# Patient Record
Sex: Male | Born: 1960
Health system: Southern US, Community
[De-identification: ages and names within clinical notes are randomized; demographics above are authoritative.]

## PROBLEM LIST (undated history)

## (undated) DIAGNOSIS — M5126 Other intervertebral disc displacement, lumbar region: Secondary | ICD-10-CM

## (undated) DIAGNOSIS — R002 Palpitations: Secondary | ICD-10-CM

## (undated) DIAGNOSIS — I10 Essential (primary) hypertension: Secondary | ICD-10-CM

## (undated) DIAGNOSIS — K219 Gastro-esophageal reflux disease without esophagitis: Secondary | ICD-10-CM

## (undated) HISTORY — DX: Other intervertebral disc displacement, lumbar region: M51.26

## (undated) HISTORY — DX: Essential (primary) hypertension: I10

## (undated) HISTORY — PX: NASAL SEPTUM SURGERY: SHX37

## (undated) HISTORY — DX: Gastro-esophageal reflux disease without esophagitis: K21.9

## (undated) HISTORY — DX: Palpitations: R00.2

---

## 1998-06-14 ENCOUNTER — Other Ambulatory Visit: Admission: RE | Admit: 1998-06-14 | Discharge: 1998-06-14 | Payer: Self-pay | Admitting: Gynecology

## 2002-10-27 ENCOUNTER — Encounter: Admission: RE | Admit: 2002-10-27 | Discharge: 2002-10-27 | Payer: Self-pay | Admitting: Otolaryngology

## 2002-10-27 ENCOUNTER — Encounter: Payer: Self-pay | Admitting: Otolaryngology

## 2005-07-25 ENCOUNTER — Encounter: Admission: RE | Admit: 2005-07-25 | Discharge: 2005-07-25 | Payer: Self-pay | Admitting: Chiropractic Medicine

## 2006-01-08 ENCOUNTER — Encounter: Admission: RE | Admit: 2006-01-08 | Discharge: 2006-01-08 | Payer: Self-pay | Admitting: *Deleted

## 2006-02-28 ENCOUNTER — Ambulatory Visit (HOSPITAL_COMMUNITY): Admission: RE | Admit: 2006-02-28 | Discharge: 2006-02-28 | Payer: Self-pay | Admitting: *Deleted

## 2008-01-16 ENCOUNTER — Emergency Department (HOSPITAL_COMMUNITY): Admission: EM | Admit: 2008-01-16 | Discharge: 2008-01-16 | Payer: Self-pay | Admitting: Family Medicine

## 2008-03-09 ENCOUNTER — Emergency Department (HOSPITAL_COMMUNITY): Admission: EM | Admit: 2008-03-09 | Discharge: 2008-03-09 | Payer: Self-pay | Admitting: Family Medicine

## 2008-07-06 ENCOUNTER — Encounter: Admission: RE | Admit: 2008-07-06 | Discharge: 2008-07-06 | Payer: Self-pay | Admitting: Sports Medicine

## 2009-03-22 ENCOUNTER — Encounter: Admission: RE | Admit: 2009-03-22 | Discharge: 2009-03-22 | Payer: Self-pay | Admitting: Sports Medicine

## 2009-05-02 ENCOUNTER — Emergency Department (HOSPITAL_COMMUNITY): Admission: EM | Admit: 2009-05-02 | Discharge: 2009-05-02 | Payer: Self-pay | Admitting: Emergency Medicine

## 2009-06-02 ENCOUNTER — Encounter: Admission: RE | Admit: 2009-06-02 | Discharge: 2009-06-02 | Payer: Self-pay | Admitting: Sports Medicine

## 2010-06-14 ENCOUNTER — Encounter: Admission: RE | Admit: 2010-06-14 | Discharge: 2010-08-13 | Payer: Self-pay | Admitting: Sports Medicine

## 2011-02-25 LAB — CBC
HCT: 42.5 % (ref 39.0–52.0)
Hemoglobin: 14.5 g/dL (ref 13.0–17.0)
MCHC: 34 g/dL (ref 30.0–36.0)
MCV: 97.9 fL (ref 78.0–100.0)
Platelets: 248 10*3/uL (ref 150–400)
RBC: 4.35 MIL/uL (ref 4.22–5.81)
RDW: 13.6 % (ref 11.5–15.5)
WBC: 7.9 10*3/uL (ref 4.0–10.5)

## 2011-02-25 LAB — TSH: TSH: 1.099 u[IU]/mL (ref 0.350–4.500)

## 2013-06-16 ENCOUNTER — Encounter: Payer: Self-pay | Admitting: Orthopedic Surgery

## 2013-06-18 ENCOUNTER — Encounter: Payer: Self-pay | Admitting: Orthopedic Surgery

## 2013-07-19 ENCOUNTER — Encounter: Payer: Self-pay | Admitting: Orthopedic Surgery

## 2013-11-12 ENCOUNTER — Other Ambulatory Visit: Payer: Self-pay | Admitting: Family Medicine

## 2013-11-12 ENCOUNTER — Ambulatory Visit
Admission: RE | Admit: 2013-11-12 | Discharge: 2013-11-12 | Disposition: A | Discharge status: Home / Self Care | Payer: 59 | Source: Ambulatory Visit | Attending: Family Medicine | Admitting: Family Medicine

## 2013-11-12 DIAGNOSIS — R0602 Shortness of breath: Secondary | ICD-10-CM

## 2015-12-06 ENCOUNTER — Encounter (HOSPITAL_COMMUNITY): Payer: Self-pay | Admitting: *Deleted

## 2015-12-06 ENCOUNTER — Emergency Department (INDEPENDENT_AMBULATORY_CARE_PROVIDER_SITE_OTHER): Payer: 59

## 2015-12-06 ENCOUNTER — Emergency Department (HOSPITAL_COMMUNITY)
Admission: EM | Admit: 2015-12-06 | Discharge: 2015-12-06 | Disposition: A | Discharge status: Home / Self Care | Payer: 59 | Source: Home / Self Care | Attending: Family Medicine | Admitting: Family Medicine

## 2015-12-06 DIAGNOSIS — M545 Low back pain, unspecified: Secondary | ICD-10-CM

## 2015-12-06 MED ORDER — CYCLOBENZAPRINE HCL 5 MG PO TABS: 5.0000 mg | ORAL_TABLET | Freq: Three times a day (TID) | ORAL | Status: DC | PRN

## 2015-12-06 MED ORDER — METHYLPREDNISOLONE 4 MG PO TBPK: ORAL_TABLET | ORAL | Status: DC

## 2015-12-06 MED ORDER — TRIAMCINOLONE ACETONIDE 40 MG/ML IJ SUSP
INTRAMUSCULAR | Status: AC
  Filled 2015-12-06: qty 1

## 2015-12-06 MED ORDER — TRIAMCINOLONE ACETONIDE 40 MG/ML IJ SUSP
40.0000 mg | Freq: Once | INTRAMUSCULAR | Status: AC
  Administered 2015-12-06: 40 mg via INTRAMUSCULAR

## 2015-12-06 MED ORDER — KETOROLAC TROMETHAMINE 30 MG/ML IJ SOLN
30.0000 mg | Freq: Once | INTRAMUSCULAR | Status: AC
  Administered 2015-12-06: 30 mg via INTRAMUSCULAR

## 2015-12-06 MED ORDER — KETOROLAC TROMETHAMINE 30 MG/ML IJ SOLN
INTRAMUSCULAR | Status: AC
  Filled 2015-12-06: qty 1

## 2015-12-06 MED FILL — CYCLOBENZAPRINE 5 MG TABLET: 5 | 10 days supply | Qty: 30 | Fill #0

## 2015-12-06 MED FILL — METHYLPREDNISOLONE 4 MG TAB: 4 | 6 days supply | Qty: 21 | Fill #0

## 2015-12-06 NOTE — ED Provider Notes (Addendum)
CSN: [id number]     Arrival date & time 12/06/15  1300 History   None    Chief Complaint  Patient presents with  . Back Pain   (Consider location/radiation/quality/duration/timing/severity/associated sxs/prior Treatment) Patient is a 55 y.o. male presenting with back pain. The history is provided by the patient.  Back Pain Location:  Lumbar spine Quality:  Stabbing Radiates to:  Does not radiate Pain severity:  Mild Onset quality:  Sudden Duration:  2 days Progression:  Unchanged Chronicity:  New Context comment:  Known ddd , onset getting oob yest am, no neuro or gi sx. Relieved by:  None tried Worsened by:  Nothing tried Ineffective treatments:  None tried Associated symptoms: no abdominal pain, no bladder incontinence, no bowel incontinence, no leg pain, no numbness, no paresthesias, no pelvic pain and no weakness     History reviewed. No pertinent past medical history. History reviewed. No pertinent past surgical history. History reviewed. No pertinent family history. Social History  Substance Use Topics  . Smoking status: Never Smoker   . Smokeless tobacco: None  . Alcohol Use: No    Review of Systems  Gastrointestinal: Negative.  Negative for abdominal pain and bowel incontinence.  Genitourinary: Negative.  Negative for bladder incontinence and pelvic pain.  Musculoskeletal: Positive for back pain. Negative for gait problem.  Skin: Negative.   Neurological: Negative for weakness, numbness and paresthesias.  All other systems reviewed and are negative.   Allergies  Review of patient's allergies indicates no known allergies.  Home Medications   Prior to Admission medications   Medication Sig Start Date End Date Taking? Authorizing Provider  cyclobenzaprine (FLEXERIL) 5 MG tablet Take 1 tablet (5 mg total) by mouth 3 (three) times daily as needed for muscle spasms. 12/06/15   Billy Fischer, MD  methylPREDNISolone (MEDROL DOSEPAK) 4 MG TBPK tablet follow package  directions, start on thurs, take until finished 12/06/15   Billy Fischer, MD   Meds Ordered and Administered this Visit   Medications  ketorolac (TORADOL) 30 MG/ML injection 30 mg (30 mg Intramuscular Given 12/06/15 1421)  triamcinolone acetonide (KENALOG-40) injection 40 mg (40 mg Intramuscular Given 12/06/15 1421)    BP 138/80 mmHg  Pulse 78  Temp(Src) 98.6 F (37 C) (Oral)  Resp 18  SpO2 99% No data found.   Physical Exam  Constitutional: He is oriented to person, place, and time. He appears well-developed and well-nourished.  Abdominal: Soft. Bowel sounds are normal. There is no tenderness.  Musculoskeletal: He exhibits tenderness.       Lumbar back: He exhibits decreased range of motion, tenderness, bony tenderness and spasm. He exhibits no swelling, no deformity, no pain and normal pulse.  Neurological: He is alert and oriented to person, place, and time.  Skin: Skin is warm and dry.  Nursing note and vitals reviewed.   ED Course  Procedures (including critical care time)  Labs Review Labs Reviewed - No data to display  Imaging Review No results found. X-rays reviewed and report per radiologist.   Visual Acuity Review  Right Eye Distance:   Left Eye Distance:   Bilateral Distance:    Right Eye Near:   Left Eye Near:    Bilateral Near:     .med   MDM   Meds ordered this encounter  Medications  . ketorolac (TORADOL) 30 MG/ML injection 30 mg    Sig:   . triamcinolone acetonide (KENALOG-40) injection 40 mg    Sig:   . methylPREDNISolone (MEDROL  DOSEPAK) 4 MG TBPK tablet    Sig: follow package directions, start on thurs, take until finished    Dispense:  21 tablet    Refill:  0  . cyclobenzaprine (FLEXERIL) 5 MG tablet    Sig: Take 1 tablet (5 mg total) by mouth 3 (three) times daily as needed for muscle spasms.    Dispense:  30 tablet    Refill:  0      Billy Fischer, MD 12/06/15 1415  Billy Fischer, MD 12/06/15 1436  Billy Fischer,  MD 12/08/15 2003

## 2015-12-06 NOTE — ED Notes (Signed)
Pt  Reports  Low  Back  Pain   Since  Yesterday     denys   Any  specefic  Injury          Pt  denys   Any        Urinary  Symptoms

## 2015-12-06 NOTE — Discharge Instructions (Signed)
Heat and medicine as needed.see orthopedist if further problems.

## 2015-12-20 DIAGNOSIS — R05 Cough: Secondary | ICD-10-CM | POA: Diagnosis not present

## 2015-12-20 DIAGNOSIS — E559 Vitamin D deficiency, unspecified: Secondary | ICD-10-CM | POA: Diagnosis not present

## 2015-12-20 DIAGNOSIS — K589 Irritable bowel syndrome without diarrhea: Secondary | ICD-10-CM | POA: Diagnosis not present

## 2015-12-20 DIAGNOSIS — R5383 Other fatigue: Secondary | ICD-10-CM | POA: Diagnosis not present

## 2015-12-20 DIAGNOSIS — K219 Gastro-esophageal reflux disease without esophagitis: Secondary | ICD-10-CM | POA: Diagnosis not present

## 2015-12-20 DIAGNOSIS — F419 Anxiety disorder, unspecified: Secondary | ICD-10-CM | POA: Diagnosis not present

## 2015-12-21 MED FILL — ZOLPIDEM TARTRATE 10 MG TAB: 10 | 30 days supply | Qty: 30 | Fill #0

## 2016-01-17 MED FILL — SM NASAL DECONGEST 30 MG TA: 30 MG | 96 days supply | Qty: 96 | Fill #0

## 2016-01-19 MED FILL — DEPO-TESTOSTERONE 200 MG/ML: 200 | 84 days supply | Qty: 6 | Fill #0 | Status: TO

## 2016-01-25 DIAGNOSIS — R0602 Shortness of breath: Secondary | ICD-10-CM | POA: Diagnosis not present

## 2016-01-25 DIAGNOSIS — J301 Allergic rhinitis due to pollen: Secondary | ICD-10-CM | POA: Diagnosis not present

## 2016-01-29 DIAGNOSIS — J301 Allergic rhinitis due to pollen: Secondary | ICD-10-CM | POA: Diagnosis not present

## 2016-02-16 MED FILL — ETODOLAC 400 MG TABLET: 400 | 90 days supply | Qty: 180 | Fill #0

## 2016-02-29 DIAGNOSIS — M5136 Other intervertebral disc degeneration, lumbar region: Secondary | ICD-10-CM | POA: Diagnosis not present

## 2016-02-29 DIAGNOSIS — M545 Low back pain: Secondary | ICD-10-CM | POA: Diagnosis not present

## 2016-03-07 DIAGNOSIS — F419 Anxiety disorder, unspecified: Secondary | ICD-10-CM | POA: Diagnosis not present

## 2016-03-07 DIAGNOSIS — J300 Vasomotor rhinitis: Secondary | ICD-10-CM | POA: Diagnosis not present

## 2016-03-07 MED FILL — predniSONE 10 MG TABS: 10 | 12 days supply | Qty: 48 | Fill #0

## 2016-03-08 MED FILL — IPRATROPIUM 0.06% SPRAY: 0.06 | 30 days supply | Qty: 15 | Fill #0

## 2016-03-08 MED FILL — LORazepam 0.5 MG TABS: 0.5 | 30 days supply | Qty: 30 | Fill #0

## 2016-03-08 MED FILL — cloNIDine HCL 0.1 MG TABS: 0.1 | 30 days supply | Qty: 30 | Fill #0

## 2016-03-12 DIAGNOSIS — H6121 Impacted cerumen, right ear: Secondary | ICD-10-CM | POA: Diagnosis not present

## 2016-03-12 DIAGNOSIS — R05 Cough: Secondary | ICD-10-CM | POA: Diagnosis not present

## 2016-03-12 DIAGNOSIS — R0981 Nasal congestion: Secondary | ICD-10-CM | POA: Diagnosis not present

## 2016-03-15 ENCOUNTER — Other Ambulatory Visit (HOSPITAL_COMMUNITY): Payer: Self-pay | Admitting: Otolaryngology

## 2016-03-15 DIAGNOSIS — R519 Headache, unspecified: Secondary | ICD-10-CM

## 2016-03-15 DIAGNOSIS — G8929 Other chronic pain: Secondary | ICD-10-CM

## 2016-03-15 DIAGNOSIS — J310 Chronic rhinitis: Secondary | ICD-10-CM

## 2016-03-15 DIAGNOSIS — R51 Headache: Secondary | ICD-10-CM

## 2016-04-02 ENCOUNTER — Ambulatory Visit (HOSPITAL_COMMUNITY): Payer: 59

## 2016-04-04 MED FILL — cloNIDine HCL 0.1 MG TABS: 0.1 | 90 days supply | Qty: 90 | Fill #1

## 2016-04-09 ENCOUNTER — Ambulatory Visit (HOSPITAL_COMMUNITY): Payer: 59

## 2016-04-10 MED FILL — SUDOGEST 30 MG TABLET: 30 | 24 days supply | Qty: 96 | Fill #1

## 2016-05-03 MED FILL — metroNIDAZOLE 250 MG TABS: 250 | 7 days supply | Qty: 21 | Fill #0

## 2016-05-17 ENCOUNTER — Inpatient Hospital Stay: Admission: RE | Admit: 2016-05-17 | Payer: 59 | Source: Ambulatory Visit

## 2016-05-23 ENCOUNTER — Ambulatory Visit
Admission: RE | Admit: 2016-05-23 | Discharge: 2016-05-23 | Disposition: A | Discharge status: Home / Self Care | Payer: 59 | Source: Ambulatory Visit | Attending: Otolaryngology | Admitting: Otolaryngology

## 2016-05-23 DIAGNOSIS — J310 Chronic rhinitis: Secondary | ICD-10-CM

## 2016-05-23 DIAGNOSIS — G8929 Other chronic pain: Secondary | ICD-10-CM

## 2016-05-23 DIAGNOSIS — J3489 Other specified disorders of nose and nasal sinuses: Secondary | ICD-10-CM | POA: Diagnosis not present

## 2016-05-23 DIAGNOSIS — R51 Headache: Secondary | ICD-10-CM

## 2016-05-23 DIAGNOSIS — R519 Headache, unspecified: Secondary | ICD-10-CM

## 2016-05-23 MED FILL — ETODOLAC 400 MG TABLET: 400 | 90 days supply | Qty: 180 | Fill #1

## 2016-05-23 MED FILL — AZELASTINE 0.15% NASAL SPRY: 0.15 | 44 days supply | Qty: 30 | Fill #0

## 2016-06-14 MED FILL — ESOMEPRAZOLE MAG DR 40 MG C: 40 | 90 days supply | Qty: 180 | Fill #0

## 2016-06-17 MED FILL — METOPROLOL TARTRATE 50 MG T: 50 | 30 days supply | Qty: 60 | Fill #0

## 2016-06-18 ENCOUNTER — Encounter: Payer: Self-pay | Admitting: Allergy and Immunology

## 2016-06-18 ENCOUNTER — Ambulatory Visit (INDEPENDENT_AMBULATORY_CARE_PROVIDER_SITE_OTHER): Payer: 59 | Admitting: Allergy and Immunology

## 2016-06-18 VITALS — BP 104/78 | HR 76 | Temp 97.9°F | Resp 16 | Ht 69.0 in | Wt 208.0 lb

## 2016-06-18 DIAGNOSIS — J309 Allergic rhinitis, unspecified: Secondary | ICD-10-CM

## 2016-06-18 DIAGNOSIS — J4530 Mild persistent asthma, uncomplicated: Secondary | ICD-10-CM | POA: Diagnosis not present

## 2016-06-18 DIAGNOSIS — J324 Chronic pansinusitis: Secondary | ICD-10-CM

## 2016-06-18 DIAGNOSIS — H1010 Acute atopic conjunctivitis, unspecified eye: Secondary | ICD-10-CM

## 2016-06-18 MED ORDER — AZELASTINE-FLUTICASONE 137-50 MCG/ACT NA SUSP: NASAL | 5 refills | Status: DC

## 2016-06-18 MED ORDER — ALBUTEROL SULFATE HFA 108 (90 BASE) MCG/ACT IN AERS: INHALATION_SPRAY | RESPIRATORY_TRACT | 1 refills | Status: DC

## 2016-06-18 MED FILL — VENTOLIN HFA 90 MCG INHALER: 108 (90 BAS | 25 days supply | Qty: 18 | Fill #0

## 2016-06-18 NOTE — Progress Notes (Signed)
NEW PATIENT NOTE  Referring Provider: No ref. provider found Primary Provider: Shirline Frees, MD Date of office visit: 06/18/2016    Subjective:   Chief Complaint:  Jason Schneider (DOB: 31-Aug-1961) is a 55 y.o. male with a chief complaint of Nasal Congestion  who presents to the clinic on 06/18/2016 with the following problems:  HPI: Jason Schneider presents to this clinic in evaluation of persistent upper airway symptoms that has been present since very early in life. Apparently he completed a course of immunotherapy around the age of 65 to the 24s and he still continued to have problems. He complains about nasal congestion and stuffiness and intermittent sneezing and feeling as though his nose is a little bit dry and his eyes are little bit dry. He can smell without much problem and he does not have any decreased ability to taste. He does have recurrent headaches averaging out to about 1 per month or so usually a frontal and forehead region without any associated neurological symptoms. He believes that provoking factors include exposure to domestic animals and pollens. He's much worse during the spring and fall especially when outdoors.  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 This problem has been going on for about 2 years or so. He exercises several times per week on a treadmill without any problem and does not have any cold air induced bronchospastic symptoms.  Apparently Jason Schneider has seen Dr. Wilburn Cornelia who performed a CT scan of his sinuses which identified some degree of chronic sinusitis. He gets treated with an antibiotic and a systemic steroid about 3 times in the past 5 years. He will use Afrin about 5 times per month when he can't breathe at nighttime. Apparently he has tried Singulair in the past but he had some type of adverse reaction and  will not use this medication.    Past Medical History:  Diagnosis Date  . GERD (gastroesophageal reflux disease)   . High blood pressure   . Lumbar herniated disc     Past Surgical History:  Procedure Laterality Date  . NASAL SEPTUM SURGERY        Medication List      CLARITIN 10 MG tablet Generic drug:  loratadine Take 10 mg by mouth daily.   esomeprazole 40 MG capsule Commonly known as:  NEXIUM Take 40 mg by mouth daily.   etodolac 400 MG tablet Commonly known as:  LODINE Take 400 mg by mouth 2 (two) times daily.   metoprolol 50 MG tablet Commonly known as:  LOPRESSOR Take 50 mg by mouth daily.   multivitamin capsule Take 1 capsule by mouth daily.   SUDAFED PO Take 1 capsule by mouth daily.   testosterone cypionate 200 MG/ML injection Commonly known as:  DEPOTESTOSTERONE CYPIONATE INJECT 0.25ML TWICE WEEKLY INTO THIGH       No Known Allergies  Review of systems negative except as noted in HPI / PMHx or noted below:  Review of Systems  Constitutional: Negative.   HENT: Negative.   Eyes: Negative.   Respiratory: Negative.   Cardiovascular: Negative.   Gastrointestinal: Negative.   Genitourinary: Negative.   Musculoskeletal: Negative.   Skin: Negative.   Neurological: Negative.   Endo/Heme/Allergies: Negative.   Psychiatric/Behavioral: Negative.     Family History  Problem Relation Age of Onset  . Cancer Brother   .  Heart attack Maternal Uncle   . Cancer Brother   . Heart attack Maternal Uncle     Social History   Social History  . Marital status: Married    Spouse name: N/A  . Number of children: N/A  . Years of education: N/A   Occupational History  . Not on file.   Social History Main Topics  . Smoking status: Never Smoker  . Smokeless tobacco: Never Used  . Alcohol use No  . Drug use: No  . Sexual activity: Not on file   Other Topics Concern  . Not on file   Social History Narrative  . No narrative on file     Environmental and Social history  Jason Schneider lives in a house with a dry environment, no animals located inside the household, would in the bedroom, plastic on the bed and pillow, and no smokers located inside the household. He is an Lobbyist and property management   Objective:   Vitals:   06/18/16 1447  BP: 104/78  Pulse: 76  Resp: 16  Temp: 97.9 F (36.6 C)   Height: 5\' 9"  (175.3 cm) Weight: 208 lb (94.3 kg)  Physical Exam  Constitutional: He is well-developed, well-nourished, and in no distress.  HENT:  Head: Normocephalic. Head is without right periorbital erythema and without left periorbital erythema.  Right Ear: Tympanic membrane, external ear and ear canal normal.  Left Ear: Tympanic membrane, external ear and ear canal normal.  Nose: Mucosal edema present. No rhinorrhea.  Mouth/Throat: Oropharynx is clear and moist and mucous membranes are normal. No oropharyngeal exudate.  Eyes: Conjunctivae and lids are normal. Pupils are equal, round, and reactive to light.  Neck: Trachea normal. No tracheal deviation present. No thyromegaly present.  Cardiovascular: Normal rate, regular rhythm, S1 normal, S2 normal and normal heart sounds.   No murmur heard. Pulmonary/Chest: Effort normal. No stridor. No tachypnea. No respiratory distress. He has no wheezes. He has no rales. He exhibits no tenderness.  Abdominal: Soft. He exhibits no distension and no mass. There is no hepatosplenomegaly. There is no tenderness. There is no rebound and no guarding.  Musculoskeletal: He exhibits no edema or tenderness.  Lymphadenopathy:       Head (right side): No tonsillar adenopathy present.       Head (left side): No tonsillar adenopathy present.    He has no cervical adenopathy.    He has no axillary adenopathy.  Neurological: He is alert. Gait normal.  Skin: No rash noted. He is not diaphoretic. No erythema. No pallor. Nails show no clubbing.  Psychiatric: Mood and affect normal.      Diagnostics: Allergy skin tests were performed. He did not demonstrate any hypersensitivity to a screening panel of aeroallergens or foods. He had a very small histamine control.  Spirometry was performed and demonstrated an FEV1 of 3.52 @ 100 % of predicted.  Results of a limited coronal sinus CT scan obtained on 05/23/2016 identified bilateral infundibular narrowing, worse on the left in association with mild mucosal thickening in multiple sinuses including maxillary, ethmoid, sphenoid, and frontal sinuses.  The patient had an Asthma Control Test with the following results:  .     Assessment and Plan:    1. Allergic rhinoconjunctivitis   2. Chronic pansinusitis   3. Asthma, well controlled, mild persistent     1. Allergen avoidance measures  2. Treat and prevent inflammation:   A. Dymista - one spray each nostril twice a day  B. prednisone 10  mg - one tablet once a day for 10 days  3. If needed:   A. OTC antihistamine - Zyrtec/Allegra/Claritin  B. Proventil HFA 2 puffs every 4-6 hours  C. nasal saline wash  4. Immunotherapy? Obtain Area 2 allergen profile  5. Return to clinic in 4 weeks or earlier if problem  Certainly Jason Schneider has a significant amount of problems with his upper airways and has developed some issues with his lower airways. Most of this appears to be mucosal swelling and hopefully he is going to benefit from allergen avoidance measures and consistently use of some anti-inflammatory medications as specified above. He would definitely be a candidate for immunotherapy if he fails medical treatment. As well, he may benefit from anatomical rearrangement of his upper airways to provide him a little bit more open conduit through his upper airway.  Allena Katz, MD [place]

## 2016-06-18 NOTE — Patient Instructions (Addendum)
  1. Allergen avoidance measures  2. Treat and prevent inflammation:   A. Dymista - one spray each nostril twice a day  B. prednisone 10 mg - one tablet once a day for 10 days  3. If needed:   A. OTC antihistamine - Zyrtec/Allegra/Claritin  B. Proventil HFA 2 puffs every 4-6 hours  C. nasal saline wash  4. Immunotherapy? Obtain area 2 aero allergen profile  5. Return to clinic in 4 weeks or earlier if problem

## 2016-06-20 DIAGNOSIS — Z7689 Persons encountering health services in other specified circumstances: Secondary | ICD-10-CM | POA: Diagnosis not present

## 2016-07-12 DIAGNOSIS — J324 Chronic pansinusitis: Secondary | ICD-10-CM | POA: Diagnosis not present

## 2016-07-12 DIAGNOSIS — J310 Chronic rhinitis: Secondary | ICD-10-CM | POA: Diagnosis not present

## 2016-07-24 MED FILL — cloNIDine HCL 0.1 MG TABS: 0.1 | 90 days supply | Qty: 90 | Fill #2

## 2016-07-25 MED FILL — SM NASAL DECONGEST 30 MG TA: 30 MG | 24 days supply | Qty: 96 | Fill #0

## 2016-08-12 MED FILL — ETODOLAC 400 MG TABLET: 400 | 90 days supply | Qty: 180 | Fill #0

## 2016-09-12 MED FILL — METOPROLOL TARTRATE 50 MG T: 50 | 30 days supply | Qty: 60 | Fill #0

## 2016-10-15 MED FILL — SM NASAL DECONGEST 30 MG TA: 30 MG | 24 days supply | Qty: 96 | Fill #1

## 2016-10-16 MED FILL — cloNIDine HCL 0.1 MG TABS: 0.1 | 90 days supply | Qty: 90 | Fill #0

## 2016-10-24 DIAGNOSIS — E291 Testicular hypofunction: Secondary | ICD-10-CM | POA: Diagnosis not present

## 2016-10-24 DIAGNOSIS — E559 Vitamin D deficiency, unspecified: Secondary | ICD-10-CM | POA: Diagnosis not present

## 2016-10-24 DIAGNOSIS — R5383 Other fatigue: Secondary | ICD-10-CM | POA: Diagnosis not present

## 2016-10-24 DIAGNOSIS — R7309 Other abnormal glucose: Secondary | ICD-10-CM | POA: Diagnosis not present

## 2016-11-06 DIAGNOSIS — M7582 Other shoulder lesions, left shoulder: Secondary | ICD-10-CM | POA: Diagnosis not present

## 2016-12-13 DIAGNOSIS — R7982 Elevated C-reactive protein (CRP): Secondary | ICD-10-CM | POA: Diagnosis not present

## 2016-12-13 DIAGNOSIS — E349 Endocrine disorder, unspecified: Secondary | ICD-10-CM | POA: Diagnosis not present

## 2016-12-13 DIAGNOSIS — R5383 Other fatigue: Secondary | ICD-10-CM | POA: Diagnosis not present

## 2016-12-13 DIAGNOSIS — G4700 Insomnia, unspecified: Secondary | ICD-10-CM | POA: Diagnosis not present

## 2016-12-13 DIAGNOSIS — M2550 Pain in unspecified joint: Secondary | ICD-10-CM | POA: Diagnosis not present

## 2016-12-23 MED FILL — METOPROLOL TARTRATE 50 MG T: 50 | 60 days supply | Qty: 120 | Fill #0

## 2016-12-23 MED FILL — ETODOLAC 400 MG TABLET: 400 | 90 days supply | Qty: 180 | Fill #0

## 2016-12-25 MED FILL — SUDOGEST 30 MG TABLET: 30 | 25 days supply | Qty: 100 | Fill #0

## 2016-12-25 MED FILL — OSELTAMIVIR PHOS 75 MG CAP: 75 | 5 days supply | Qty: 10 | Fill #0

## 2017-01-03 DIAGNOSIS — R5383 Other fatigue: Secondary | ICD-10-CM | POA: Diagnosis not present

## 2017-01-09 DIAGNOSIS — R7982 Elevated C-reactive protein (CRP): Secondary | ICD-10-CM | POA: Diagnosis not present

## 2017-01-09 DIAGNOSIS — E7212 Methylenetetrahydrofolate reductase deficiency: Secondary | ICD-10-CM | POA: Diagnosis not present

## 2017-01-09 DIAGNOSIS — M2550 Pain in unspecified joint: Secondary | ICD-10-CM | POA: Diagnosis not present

## 2017-01-09 DIAGNOSIS — R5383 Other fatigue: Secondary | ICD-10-CM | POA: Diagnosis not present

## 2017-01-15 DIAGNOSIS — R002 Palpitations: Secondary | ICD-10-CM | POA: Diagnosis not present

## 2017-01-15 DIAGNOSIS — R945 Abnormal results of liver function studies: Secondary | ICD-10-CM | POA: Diagnosis not present

## 2017-02-03 MED FILL — cloNIDine HCL 0.1 MG TABS: 0.1 | 90 days supply | Qty: 90 | Fill #0

## 2017-02-11 DIAGNOSIS — M25512 Pain in left shoulder: Secondary | ICD-10-CM | POA: Diagnosis not present

## 2017-02-12 DIAGNOSIS — M25512 Pain in left shoulder: Secondary | ICD-10-CM | POA: Diagnosis not present

## 2017-02-20 NOTE — Progress Notes (Signed)
Cardiology Office Note  Date:  02/21/2017   ID:  Jason Schneider, DOB 09/16/1961, MRN [id number]  PCP:  Shirline Frees, MD   Chief Complaint  Patient presents with  . other    New patient. Referred by Shirline Frees. Patient c/o heart skipping beats and SOB. Meds review verbally with patient.     HPI:  56 year old gentleman with history of hypertension, GERD, palpitations, insomnia, anxiety  Chronic Sinus problems, rhinitis Who presents by referral from Dr. Shirline Frees for consultation of his palpitations.  He reports that he is typically a type A personality person Seems to have bursts of "adrenaline"commonly in the morning. Periodically takes his metoprolol Tartrate for that feeling of excitement/energy. The Toprol seems to help his symptoms. He does not take this on a regular basis  Over the past few years has noticed rare episodes of shortness of breath, sometimes coming during the daytime, often at night when he is in a relaxed state When he feels a shortness of breath feeling, he checks his pulse. Often will feel regular rhythm with occasional pauses. He is not symptomatic from the beating only from the shortness of breath seems to resolve on its own without intervention  Otherwise he is very active, goes to the gym in the evenings on a regular basis. He has not noticed any change in his exertional capacity  Continues to have problems with chronic rhinitis, "very sensitive nose" Periodically take Sudafed among other allergy medications Has not noticed a clear correlation between his heart arrhythmia and his medication  Flu type symptoms February 2018 "down for a few weeks"  In terms of his family history, father lived to age 36,   EKG on today's visit person only reviewed by myself shows normal sinus rhythm with rate 69 bpm no significant ST or T-wave changes  PMH:   has a past medical history of GERD (gastroesophageal reflux disease); High blood pressure; Lumbar  herniated disc; and Palpitations.  PSH:    Past Surgical History:  Procedure Laterality Date  . NASAL SEPTUM SURGERY      Current Outpatient Prescriptions  Medication Sig Dispense Refill  . albuterol (PROVENTIL HFA) 108 (90 Base) MCG/ACT inhaler Inhale two puffs every four to six hours as needed for cough or wheeze. 1 Inhaler 1  . Azelastine-Fluticasone (DYMISTA) 137-50 MCG/ACT SUSP Use one spray in each nostril twice daily as directed. 1 Bottle 5  . etodolac (LODINE) 400 MG tablet Take 400 mg by mouth 2 (two) times daily.     Marland Kitchen loratadine (CLARITIN) 10 MG tablet Take 10 mg by mouth daily.    . metoprolol (LOPRESSOR) 50 MG tablet Take 50 mg by mouth daily.    . Multiple Vitamin (MULTIVITAMIN) capsule Take 1 capsule by mouth daily.    . Pseudoephedrine HCl (SUDAFED PO) Take 1 capsule by mouth daily.    Marland Kitchen testosterone cypionate (DEPOTESTOSTERONE CYPIONATE) 200 MG/ML injection INJECT 0.25ML TWICE WEEKLY INTO THIGH  2  . nebivolol (BYSTOLIC) 5 MG tablet Take 1 tablet (5 mg total) by mouth daily. 30 tablet   . propranolol (INDERAL) 20 MG tablet Take 1 tablet (20 mg total) by mouth 3 (three) times daily as needed. 90 tablet 3   No current facility-administered medications for this visit.      Allergies:   Patient has no known allergies.   Social History:  The patient  reports that he has never smoked. He has never used smokeless tobacco. He reports that he does not drink alcohol  or use drugs.   Family History:   family history includes Cancer in his brother and brother; Heart attack in his maternal uncle and maternal uncle.    Review of Systems: Review of Systems  Constitutional: Negative.   HENT: Positive for congestion.   Respiratory: Negative.   Cardiovascular: Positive for palpitations.  Gastrointestinal: Negative.   Musculoskeletal: Negative.   Neurological: Negative.   Psychiatric/Behavioral: Negative.   All other systems reviewed and are negative.    PHYSICAL EXAM: VS:   BP 112/68 (BP Location: Right Arm, Patient Position: Sitting, Cuff Size: Normal)   Pulse 69   Ht 5\' 10"  (1.778 m)   Wt 211 lb 8 oz (95.9 kg)   BMI 30.35 kg/m  , BMI Body mass index is 30.35 kg/m. GEN: Well nourished, well developed, in no acute distress  HEENT: normal  Neck: no JVD, carotid bruits, or masses Cardiac: RRR; no murmurs, rubs, or gallops,no edema  Respiratory:  clear to auscultation bilaterally, normal work of breathing GI: soft, nontender, nondistended, + BS MS: no deformity or atrophy  Skin: warm and dry, no rash Neuro:  Strength and sensation are intact Psych: euthymic mood, full affect    Recent Labs: No results found for requested labs within last 8760 hours.    Lipid Panel No results found for: CHOL, HDL, LDLCALC, TRIG    Wt Readings from Last 3 Encounters:  02/21/17 211 lb 8 oz (95.9 kg)  06/18/16 208 lb (94.3 kg)       ASSESSMENT AND PLAN:  Essential hypertension - Plan: EKG 12-Lead Blood pressure well controlled on his current medication regiment He takes metoprolol as needed for anxiety feeling Discussed various other medications for managing his palpitations as detailed below  Palpitations - Plan: EKG 12-Lead Long discussion with him concerning his palpitations and shortness of breath Normal EKG, normal clinical exam Symptoms consistent with either APCs or PVCs as he describes a short pause periodically.  We have offered a Holter or Zio monitor if symptoms get worse. This would help identify his arrhythmia. In an effort to treat his symptoms, options include taking metoprolol more consistently twice a day, or changing to metoprolol succinate once a day. We did offer pill in the pocket approach with propranolol and he could take this either in the morning when he gets "adrenaline rushes" lasting 1-2 hours, and he could take propranolol in the evening when he notices his palpitations and shortness of breath. Alternatively we have offered him a  trial of bystolic which can be used for both blood pressure and helps to treat underlying arrhythmia/palpitations typically with good effect with low side effects of fatigue. He did report having some fatigue on the metoprolol. Samples of the bystolic were provided to him for trial, 5 mg doses. Recommended he try 5 mg daily with max dose of 10 mg daily given his low blood pressure.  SOB (shortness of breath) - Plan: EKG 12-Lead Etiology of his shortness of breath is unclear but possibly associated with his ectopy.  Low likelihood of underlying coronary disease and ischemia given low cholesterol, no diabetes, no smoking history.   Gastroesophageal reflux disease without esophagitis Reports relatively well-controlled GERD symptoms  Anxiety Periodically has bursts of adrenaline, treated in the past with metoprolol which he takes as needed Discussed other treatment options with propranolol ( prescription provided), metoprolol succinate which he can take once a day and samples provided of bystolic ( low side effect option).   Total encounter time more than 60  minutes  Greater than 50% was spent in counseling and coordination of care with the patient  Disposition:   F/U  As needed  Mr. Nakajima was seen in consultation for Mr. Shirline Frees and will be referred back to Mr. Kenton Kingfisher for continued care of his medical issues as detailed above.   Orders Placed This Encounter  Procedures  . EKG 12-Lead     Signed, Esmond Plants, M.D., Ph.D. 02/21/2017  [place], [place]

## 2017-02-21 ENCOUNTER — Encounter: Payer: Self-pay | Admitting: Cardiovascular Disease

## 2017-02-21 ENCOUNTER — Ambulatory Visit (INDEPENDENT_AMBULATORY_CARE_PROVIDER_SITE_OTHER): Payer: 59 | Admitting: Cardiovascular Disease

## 2017-02-21 VITALS — BP 112/68 | HR 69 | Ht 70.0 in | Wt 211.5 lb

## 2017-02-21 DIAGNOSIS — I10 Essential (primary) hypertension: Secondary | ICD-10-CM | POA: Diagnosis not present

## 2017-02-21 DIAGNOSIS — R002 Palpitations: Secondary | ICD-10-CM | POA: Insufficient documentation

## 2017-02-21 DIAGNOSIS — R0602 Shortness of breath: Secondary | ICD-10-CM | POA: Insufficient documentation

## 2017-02-21 DIAGNOSIS — K219 Gastro-esophageal reflux disease without esophagitis: Secondary | ICD-10-CM | POA: Insufficient documentation

## 2017-02-21 DIAGNOSIS — F419 Anxiety disorder, unspecified: Secondary | ICD-10-CM

## 2017-02-21 MED ORDER — PROPRANOLOL HCL 20 MG PO TABS: 20.0000 mg | ORAL_TABLET | Freq: Three times a day (TID) | ORAL | 3 refills | Status: DC | PRN

## 2017-02-21 NOTE — Patient Instructions (Addendum)
Medication Instructions:    Metoprolol lasts 12 hours  Try propranolol for short acting relief 1/2 or whole  4 to 6 hours  Try bystolic 5 to 10 mg  24 hour pill   Labwork:  No new labs needed  Testing/Procedures:  No further testing at this time  Research a CT coronary calcium score Do the scan only for chest pain symptoms   I recommend watching educational videos on topics of interest to you at:       [email or web address].goemmi.com  Enter code: HEARTCARE    Follow-Up: It was a pleasure seeing you in the office today. Please call us if you have new issues that need to be addressed before your next appt.  [phone]  Your physician wants you to follow-up in:  As needed  If you need a refill on your cardiac medications before your next appointment, please call your pharmacy.     Coronary Calcium Scan A coronary calcium scan is an imaging test used to look for deposits of calcium and other fatty materials (plaques) in the inner lining of the blood vessels of the heart (coronary arteries). These deposits of calcium and plaques can partly clog and narrow the coronary arteries without producing any symptoms or warning signs. This puts a person at risk for a heart attack. This test can detect these deposits before symptoms develop. Tell a health care provider about:  Any allergies you have.  All medicines you are taking, including vitamins, herbs, eye drops, creams, and over-the-counter medicines.  Any problems you or family members have had with anesthetic medicines.  Any blood disorders you have.  Any surgeries you have had.  Any medical conditions you have.  Whether you are pregnant or may be pregnant. What are the risks? Generally, this is a safe procedure. However, problems may occur, including:  Harm to a pregnant woman and her unborn baby. This test involves the use of radiation. Radiation exposure can be dangerous to a pregnant woman and her unborn baby. If you are  pregnant, you generally should not have this procedure done.  Slight increase in the risk of cancer. This is because of the radiation involved in the test. What happens before the procedure? No preparation is needed for this procedure. What happens during the procedure?  You will undress and remove any jewelry around your neck or chest.  You will put on a hospital gown.  Sticky electrodes will be placed on your chest. The electrodes will be connected to an electrocardiogram (ECG) machine to record a tracing of the electrical activity of your heart.  A CT scanner will take pictures of your heart. During this time, you will be asked to lie still and hold your breath for 2-3 seconds while a picture of your heart is being taken. The procedure may vary among health care providers and hospitals. What happens after the procedure?  You can get dressed.  You can return to your normal activities.  It is up to you to get the results of your test. Ask your health care provider, or the department that is doing the test, when your results will be ready. Summary  A coronary calcium scan is an imaging test used to look for deposits of calcium and other fatty materials (plaques) in the inner lining of the blood vessels of the heart (coronary arteries).  Generally, this is a safe procedure. Tell your health care provider if you are pregnant or may be pregnant.  No preparation is needed for  this procedure.  A CT scanner will take pictures of your heart.  You can return to your normal activities after the scan is done. This information is not intended to replace advice given to you by your health care provider. Make sure you discuss any questions you have with your health care provider. Document Released: 05/02/2008 Document Revised: 09/23/2016 Document Reviewed: 09/23/2016 Elsevier Interactive Patient Education  2017 [employer].

## 2017-03-26 MED FILL — SUDOGEST 30 MG TABLET: 30 | 25 days supply | Qty: 100 | Fill #1

## 2017-04-23 MED FILL — ETODOLAC 400 MG TABLET: 400 | 90 days supply | Qty: 180 | Fill #0

## 2017-04-23 MED FILL — METOPROLOL TARTRATE 50 MG T: 50 | 90 days supply | Qty: 180 | Fill #0

## 2017-05-26 DIAGNOSIS — E559 Vitamin D deficiency, unspecified: Secondary | ICD-10-CM | POA: Diagnosis not present

## 2017-05-26 DIAGNOSIS — R5383 Other fatigue: Secondary | ICD-10-CM | POA: Diagnosis not present

## 2017-05-26 DIAGNOSIS — R7309 Other abnormal glucose: Secondary | ICD-10-CM | POA: Diagnosis not present

## 2017-05-26 DIAGNOSIS — E291 Testicular hypofunction: Secondary | ICD-10-CM | POA: Diagnosis not present

## 2017-06-13 MED FILL — SUDOGEST 30 MG TABLET: 30 | 25 days supply | Qty: 100 | Fill #0

## 2017-07-07 DIAGNOSIS — M79641 Pain in right hand: Secondary | ICD-10-CM | POA: Diagnosis not present

## 2017-08-06 MED FILL — cloNIDine HCL 0.1 MG TABS: 0.1 | 90 days supply | Qty: 90 | Fill #0

## 2017-08-06 MED FILL — ETODOLAC 400 MG TABLET: 400 | 90 days supply | Qty: 180 | Fill #0

## 2017-08-06 MED FILL — SUDOGEST 30 MG TABLET: 30 | 25 days supply | Qty: 100 | Fill #1

## 2017-10-30 MED FILL — METOPROLOL TARTRATE 50 MG T: 50 | 90 days supply | Qty: 180 | Fill #0

## 2017-11-26 MED FILL — SUDOGEST 30 MG TABLET: 30 | 50 days supply | Qty: 200 | Fill #0

## 2017-12-03 MED FILL — ETODOLAC 400 MG TABLET: 400 | 90 days supply | Qty: 180 | Fill #0

## 2018-02-26 ENCOUNTER — Other Ambulatory Visit: Payer: Self-pay | Admitting: Allergy and Immunology

## 2018-02-26 MED FILL — VENTOLIN HFA 90 MCG INHALER: 108 (90 BAS | 17 days supply | Qty: 18 | Fill #0

## 2018-03-12 ENCOUNTER — Other Ambulatory Visit: Payer: Self-pay | Admitting: Sports Medicine

## 2018-03-12 DIAGNOSIS — M7582 Other shoulder lesions, left shoulder: Secondary | ICD-10-CM | POA: Diagnosis not present

## 2018-03-12 DIAGNOSIS — M7522 Bicipital tendinitis, left shoulder: Secondary | ICD-10-CM

## 2018-03-12 MED FILL — predniSONE 10 MG TABS: 10 | 12 days supply | Qty: 48 | Fill #0

## 2018-03-23 ENCOUNTER — Other Ambulatory Visit: Payer: 59

## 2018-04-03 DIAGNOSIS — R5383 Other fatigue: Secondary | ICD-10-CM | POA: Diagnosis not present

## 2018-04-03 DIAGNOSIS — Z7689 Persons encountering health services in other specified circumstances: Secondary | ICD-10-CM | POA: Diagnosis not present

## 2018-04-03 DIAGNOSIS — E559 Vitamin D deficiency, unspecified: Secondary | ICD-10-CM | POA: Diagnosis not present

## 2018-04-03 DIAGNOSIS — R7309 Other abnormal glucose: Secondary | ICD-10-CM | POA: Diagnosis not present

## 2018-04-03 DIAGNOSIS — E291 Testicular hypofunction: Secondary | ICD-10-CM | POA: Diagnosis not present

## 2018-04-10 MED FILL — ETODOLAC 400 MG TABLET: 400 | 90 days supply | Qty: 180 | Fill #0

## 2018-05-01 MED FILL — METOPROLOL TARTRATE 50 MG T: 50 | 90 days supply | Qty: 180 | Fill #0

## 2018-05-07 ENCOUNTER — Other Ambulatory Visit: Payer: Self-pay | Admitting: *Deleted

## 2018-05-07 ENCOUNTER — Other Ambulatory Visit: Payer: Self-pay | Admitting: Allergy and Immunology

## 2018-05-07 NOTE — Telephone Encounter (Signed)
Patient has requested a one time refill of their albuterol inhaler while they are on vacation at [facility]. The refill has been denied since they have not been seen since 2017.

## 2018-05-22 MED FILL — SUDOGEST 30 MG TABLET: 30 | 25 days supply | Qty: 100 | Fill #0

## 2018-07-06 DIAGNOSIS — Z1211 Encounter for screening for malignant neoplasm of colon: Secondary | ICD-10-CM | POA: Diagnosis not present

## 2018-07-06 DIAGNOSIS — Z125 Encounter for screening for malignant neoplasm of prostate: Secondary | ICD-10-CM | POA: Diagnosis not present

## 2018-07-06 DIAGNOSIS — M545 Low back pain: Secondary | ICD-10-CM | POA: Diagnosis not present

## 2018-07-06 DIAGNOSIS — R002 Palpitations: Secondary | ICD-10-CM | POA: Diagnosis not present

## 2018-07-06 DIAGNOSIS — Z0000 Encounter for general adult medical examination without abnormal findings: Secondary | ICD-10-CM | POA: Diagnosis not present

## 2018-07-06 DIAGNOSIS — B351 Tinea unguium: Secondary | ICD-10-CM | POA: Diagnosis not present

## 2018-07-06 DIAGNOSIS — L723 Sebaceous cyst: Secondary | ICD-10-CM | POA: Diagnosis not present

## 2018-07-06 DIAGNOSIS — J301 Allergic rhinitis due to pollen: Secondary | ICD-10-CM | POA: Diagnosis not present

## 2018-07-06 MED FILL — CICLOPIROX 8% SOLUTION: 8 | 14 days supply | Qty: 7 | Fill #0

## 2018-07-08 MED FILL — AZELASTINE 0.15% NASAL SPRY: 0.15 | 50 days supply | Qty: 30 | Fill #0

## 2018-07-15 ENCOUNTER — Inpatient Hospital Stay: Admission: RE | Admit: 2018-07-15 | Payer: 59 | Source: Ambulatory Visit

## 2018-07-28 MED FILL — ETODOLAC 400 MG TABLET: 400 | 90 days supply | Qty: 180 | Fill #0

## 2018-08-05 ENCOUNTER — Other Ambulatory Visit: Payer: 59

## 2018-08-19 MED FILL — ESOMEPRAZOLE MAG DR 40 MG C: 40 | 90 days supply | Qty: 90 | Fill #0

## 2018-08-19 MED FILL — SUDOGEST 30 MG TABLET: 30 | 25 days supply | Qty: 100 | Fill #0

## 2018-08-25 ENCOUNTER — Other Ambulatory Visit: Payer: 59

## 2018-09-16 ENCOUNTER — Other Ambulatory Visit: Payer: 59

## 2018-09-28 MED FILL — VENTOLIN HFA 90 MCG INHALER: 108 (90 BAS | 17 days supply | Qty: 18 | Fill #0

## 2018-10-29 DIAGNOSIS — R7989 Other specified abnormal findings of blood chemistry: Secondary | ICD-10-CM | POA: Diagnosis not present

## 2018-10-29 DIAGNOSIS — R5383 Other fatigue: Secondary | ICD-10-CM | POA: Diagnosis not present

## 2018-10-29 DIAGNOSIS — K589 Irritable bowel syndrome without diarrhea: Secondary | ICD-10-CM | POA: Diagnosis not present

## 2018-10-29 DIAGNOSIS — E7212 Methylenetetrahydrofolate reductase deficiency: Secondary | ICD-10-CM | POA: Diagnosis not present

## 2018-11-06 MED FILL — METOPROLOL TARTRATE 50 MG T: 50 | 90 days supply | Qty: 180 | Fill #0

## 2018-11-06 MED FILL — ETODOLAC 400 MG TABLET: 400 | 90 days supply | Qty: 180 | Fill #0

## 2018-11-10 MED FILL — SUDOGEST 30 MG TABLET: 30 | 25 days supply | Qty: 100 | Fill #1

## 2019-01-13 MED FILL — AZELASTINE HCL 137 MCG SPRY: 0.1 | 90 days supply | Qty: 60 | Fill #0

## 2019-01-13 MED FILL — SUDOGEST 30 MG TABLET: 30 | 25 days supply | Qty: 100 | Fill #0

## 2019-01-13 MED FILL — VENTOLIN HFA 90 MCG INHALER: 108 (90 BAS | 25 days supply | Qty: 18 | Fill #0

## 2019-01-22 DIAGNOSIS — R6889 Other general symptoms and signs: Secondary | ICD-10-CM | POA: Diagnosis not present

## 2019-01-22 DIAGNOSIS — J0110 Acute frontal sinusitis, unspecified: Secondary | ICD-10-CM | POA: Diagnosis not present

## 2019-01-22 MED FILL — predniSONE 10 MG TABS: 10 | 8 days supply | Qty: 20 | Fill #0

## 2019-01-22 MED FILL — DOXYCYCLINE HYCLATE 100 MG: 100 | 10 days supply | Qty: 20 | Fill #0

## 2019-01-25 DIAGNOSIS — S43085A Other dislocation of left shoulder joint, initial encounter: Secondary | ICD-10-CM | POA: Diagnosis not present

## 2019-01-25 DIAGNOSIS — S43015A Anterior dislocation of left humerus, initial encounter: Secondary | ICD-10-CM | POA: Diagnosis not present

## 2019-01-25 DIAGNOSIS — S4990XA Unspecified injury of shoulder and upper arm, unspecified arm, initial encounter: Secondary | ICD-10-CM | POA: Diagnosis not present

## 2019-01-25 DIAGNOSIS — S43012A Anterior subluxation of left humerus, initial encounter: Secondary | ICD-10-CM | POA: Diagnosis not present

## 2019-01-25 DIAGNOSIS — S4992XA Unspecified injury of left shoulder and upper arm, initial encounter: Secondary | ICD-10-CM | POA: Diagnosis not present

## 2019-01-25 DIAGNOSIS — Z9889 Other specified postprocedural states: Secondary | ICD-10-CM | POA: Diagnosis not present

## 2019-01-25 DIAGNOSIS — M25512 Pain in left shoulder: Secondary | ICD-10-CM | POA: Diagnosis not present

## 2019-01-27 ENCOUNTER — Other Ambulatory Visit: Payer: Self-pay | Admitting: Sports Medicine

## 2019-01-27 DIAGNOSIS — S43005A Unspecified dislocation of left shoulder joint, initial encounter: Secondary | ICD-10-CM | POA: Diagnosis not present

## 2019-01-27 DIAGNOSIS — M25512 Pain in left shoulder: Secondary | ICD-10-CM

## 2019-01-27 MED FILL — DIAZEPAM 10 MG TABS: 10 | 1 days supply | Qty: 1 | Fill #0

## 2019-02-05 ENCOUNTER — Other Ambulatory Visit: Payer: Self-pay

## 2019-02-05 ENCOUNTER — Ambulatory Visit
Admission: RE | Admit: 2019-02-05 | Discharge: 2019-02-05 | Disposition: A | Discharge status: Home / Self Care | Payer: 59 | Source: Ambulatory Visit | Attending: Sports Medicine | Admitting: Sports Medicine

## 2019-02-05 DIAGNOSIS — M25512 Pain in left shoulder: Secondary | ICD-10-CM | POA: Diagnosis not present

## 2019-02-12 DIAGNOSIS — S46012A Strain of muscle(s) and tendon(s) of the rotator cuff of left shoulder, initial encounter: Secondary | ICD-10-CM | POA: Diagnosis not present

## 2019-02-19 DIAGNOSIS — M25512 Pain in left shoulder: Secondary | ICD-10-CM | POA: Diagnosis not present

## 2019-02-19 DIAGNOSIS — S46012A Strain of muscle(s) and tendon(s) of the rotator cuff of left shoulder, initial encounter: Secondary | ICD-10-CM | POA: Diagnosis not present

## 2019-02-19 DIAGNOSIS — S43006A Unspecified dislocation of unspecified shoulder joint, initial encounter: Secondary | ICD-10-CM | POA: Diagnosis not present

## 2019-04-07 MED FILL — SUDOGEST 30 MG TABLET: 30 | 25 days supply | Qty: 100 | Fill #0

## 2019-04-07 MED FILL — ETODOLAC 400 MG TABLET: 400 | 90 days supply | Qty: 180 | Fill #0

## 2019-04-07 MED FILL — METOPROLOL TARTRATE 50 MG T: 50 | 90 days supply | Qty: 90 | Fill #0

## 2019-06-02 DIAGNOSIS — R5383 Other fatigue: Secondary | ICD-10-CM | POA: Diagnosis not present

## 2019-06-02 DIAGNOSIS — J329 Chronic sinusitis, unspecified: Secondary | ICD-10-CM | POA: Diagnosis not present

## 2019-06-02 DIAGNOSIS — G4700 Insomnia, unspecified: Secondary | ICD-10-CM | POA: Diagnosis not present

## 2019-06-02 DIAGNOSIS — H219 Unspecified disorder of iris and ciliary body: Secondary | ICD-10-CM | POA: Diagnosis not present

## 2019-06-02 DIAGNOSIS — R7989 Other specified abnormal findings of blood chemistry: Secondary | ICD-10-CM | POA: Diagnosis not present

## 2019-06-02 MED FILL — ESOMEPRAZOLE MAG DR 40 MG C: 40 | 90 days supply | Qty: 90 | Fill #0

## 2019-06-02 MED FILL — SUDOGEST 30 MG TABLET: 30 | 25 days supply | Qty: 100 | Fill #1

## 2019-06-14 DIAGNOSIS — R5383 Other fatigue: Secondary | ICD-10-CM | POA: Diagnosis not present

## 2019-06-16 DIAGNOSIS — M47812 Spondylosis without myelopathy or radiculopathy, cervical region: Secondary | ICD-10-CM | POA: Diagnosis not present

## 2019-06-16 DIAGNOSIS — M542 Cervicalgia: Secondary | ICD-10-CM | POA: Diagnosis not present

## 2019-06-16 MED FILL — METHOCARBAMOL 500 MG TABS: 500 | 13 days supply | Qty: 40 | Fill #0

## 2019-06-25 MED FILL — predniSONE 10 MG TABS: 10 | 6 days supply | Qty: 21 | Fill #0

## 2019-07-30 MED FILL — METOPROLOL TARTRATE 50 MG T: 50 | 90 days supply | Qty: 90 | Fill #0

## 2019-07-30 MED FILL — ETODOLAC 400 MG TABS: 400 | 90 days supply | Qty: 180 | Fill #0

## 2019-07-30 MED FILL — SUDOGEST 30 MG TABLET: 30 | 25 days supply | Qty: 100 | Fill #0

## 2019-09-10 DIAGNOSIS — R251 Tremor, unspecified: Secondary | ICD-10-CM | POA: Diagnosis not present

## 2019-09-10 DIAGNOSIS — R5383 Other fatigue: Secondary | ICD-10-CM | POA: Diagnosis not present

## 2019-09-10 DIAGNOSIS — E7212 Methylenetetrahydrofolate reductase deficiency: Secondary | ICD-10-CM | POA: Diagnosis not present

## 2019-10-12 MED FILL — AZELASTINE HCL 137 MCG SPRY: 0.1 | 90 days supply | Qty: 60 | Fill #0

## 2019-10-13 MED FILL — CLOTRIMAZOLE-BETAMETHASONE: 1-0.05 | 14 days supply | Qty: 15 | Fill #0

## 2019-11-17 MED FILL — ETODOLAC 400 MG TABS: 400 | 90 days supply | Qty: 180 | Fill #0

## 2019-11-17 MED FILL — METOPROLOL TARTRATE 50 MG T: 50 | 90 days supply | Qty: 90 | Fill #0

## 2019-12-09 ENCOUNTER — Ambulatory Visit: Payer: 59 | Attending: Internal Medicine

## 2019-12-09 DIAGNOSIS — Z20822 Contact with and (suspected) exposure to covid-19: Secondary | ICD-10-CM

## 2019-12-10 LAB — NOVEL CORONAVIRUS, NAA: SARS-CoV-2, NAA: DETECTED — AB

## 2019-12-12 ENCOUNTER — Telehealth: Payer: Self-pay | Admitting: Adult Health

## 2019-12-12 ENCOUNTER — Encounter: Payer: Self-pay | Admitting: Adult Health

## 2019-12-12 NOTE — Telephone Encounter (Signed)
Called and LMOM about patient COVID positivity and our request to discuss with him further about his eligibility for monoclonal antibody therapy.  My chart message sent.    Wilber Bihari, NP

## 2019-12-13 ENCOUNTER — Other Ambulatory Visit: Payer: Self-pay | Admitting: Adult Health

## 2019-12-13 DIAGNOSIS — U071 COVID-19: Secondary | ICD-10-CM

## 2019-12-13 DIAGNOSIS — R05 Cough: Secondary | ICD-10-CM | POA: Diagnosis not present

## 2019-12-13 DIAGNOSIS — R519 Headache, unspecified: Secondary | ICD-10-CM | POA: Diagnosis not present

## 2019-12-13 NOTE — Progress Notes (Signed)
  I connected by phone with Jason Schneider on 12/13/2019 at 6:30 PM to discuss the potential use of an new treatment for mild to moderate COVID-19 viral infection in non-hospitalized patients.  This patient is a 59 y.o. male that meets the FDA criteria for Emergency Use Authorization of bamlanivimab or casirivimab\imdevimab.  Has a (+) direct SARS-CoV-2 viral test result  Has mild or moderate COVID-19   Is ? 60 years of age and weighs ? 40 kg  Is NOT hospitalized due to COVID-19  Is NOT requiring oxygen therapy or requiring an increase in baseline oxygen flow rate due to COVID-19  Is within 10 days of symptom onset  Has at least one of the high risk factor(s) for progression to severe COVID-19 and/or hospitalization as defined in EUA.  Specific high risk criteria : Hypertension   I have spoken and communicated the following to the patient or parent/caregiver: Spoke with patient , sx started 12/07/19. Last day for infusion 12/17/19 .   1. FDA has authorized the emergency use of bamlanivimab and casirivimab\imdevimab for the treatment of mild to moderate COVID-19 in adults and pediatric patients with positive results of direct SARS-CoV-2 viral testing who are 35 years of age and older weighing at least 40 kg, and who are at high risk for progressing to severe COVID-19 and/or hospitalization.  2. The significant known and potential risks and benefits of bamlanivimab and casirivimab\imdevimab, and the extent to which such potential risks and benefits are unknown.  3. Information on available alternative treatments and the risks and benefits of those alternatives, including clinical trials.  4. Patients treated with bamlanivimab and casirivimab\imdevimab should continue to self-isolate and use infection control measures (e.g., wear mask, isolate, social distance, avoid sharing personal items, clean and disinfect "high touch" surfaces, and frequent handwashing) according to CDC guidelines.    5. The patient or parent/caregiver has the option to accept or refuse bamlanivimab or casirivimab\imdevimab .  After reviewing this information with the patient, The patient agreed to proceed with receiving the bamlanimivab infusion and will be provided a copy of the Fact sheet prior to receiving the infusion..  Scheduled on 12/17/19 at 1230.   [name] 12/13/2019 6:30 PM

## 2019-12-15 DIAGNOSIS — U071 COVID-19: Secondary | ICD-10-CM | POA: Diagnosis not present

## 2019-12-16 ENCOUNTER — Ambulatory Visit (HOSPITAL_COMMUNITY): Payer: 59

## 2019-12-16 DIAGNOSIS — U071 COVID-19: Secondary | ICD-10-CM | POA: Diagnosis not present

## 2019-12-16 DIAGNOSIS — I10 Essential (primary) hypertension: Secondary | ICD-10-CM | POA: Diagnosis not present

## 2019-12-16 DIAGNOSIS — Z7689 Persons encountering health services in other specified circumstances: Secondary | ICD-10-CM | POA: Diagnosis not present

## 2019-12-17 ENCOUNTER — Ambulatory Visit (HOSPITAL_COMMUNITY): Payer: 59

## 2020-01-03 MED FILL — SUDOGEST 30 MG TABLET: 30 | 25 days supply | Qty: 100 | Fill #2

## 2020-01-03 MED FILL — ALBUTEROL SULFATE HFA 108 (: 108 (90 BAS | 25 days supply | Qty: 18 | Fill #1

## 2020-01-15 DIAGNOSIS — U071 COVID-19: Secondary | ICD-10-CM | POA: Diagnosis not present

## 2020-01-25 DIAGNOSIS — J301 Allergic rhinitis due to pollen: Secondary | ICD-10-CM | POA: Diagnosis not present

## 2020-01-25 DIAGNOSIS — Z125 Encounter for screening for malignant neoplasm of prostate: Secondary | ICD-10-CM | POA: Diagnosis not present

## 2020-01-25 DIAGNOSIS — Z0000 Encounter for general adult medical examination without abnormal findings: Secondary | ICD-10-CM | POA: Diagnosis not present

## 2020-01-25 DIAGNOSIS — E782 Mixed hyperlipidemia: Secondary | ICD-10-CM | POA: Diagnosis not present

## 2020-01-25 DIAGNOSIS — I10 Essential (primary) hypertension: Secondary | ICD-10-CM | POA: Diagnosis not present

## 2020-01-25 DIAGNOSIS — R002 Palpitations: Secondary | ICD-10-CM | POA: Diagnosis not present

## 2020-01-25 DIAGNOSIS — M545 Low back pain: Secondary | ICD-10-CM | POA: Diagnosis not present

## 2020-01-25 DIAGNOSIS — Z1211 Encounter for screening for malignant neoplasm of colon: Secondary | ICD-10-CM | POA: Diagnosis not present

## 2020-01-25 DIAGNOSIS — K219 Gastro-esophageal reflux disease without esophagitis: Secondary | ICD-10-CM | POA: Diagnosis not present

## 2020-01-28 ENCOUNTER — Institutional Professional Consult (permissible substitution): Payer: 59 | Admitting: Pulmonary Disease

## 2020-01-28 ENCOUNTER — Ambulatory Visit (INDEPENDENT_AMBULATORY_CARE_PROVIDER_SITE_OTHER): Payer: 59

## 2020-01-28 ENCOUNTER — Other Ambulatory Visit: Payer: Self-pay

## 2020-01-28 ENCOUNTER — Encounter: Payer: Self-pay | Admitting: Pulmonary Disease

## 2020-01-28 ENCOUNTER — Ambulatory Visit (INDEPENDENT_AMBULATORY_CARE_PROVIDER_SITE_OTHER): Payer: 59 | Admitting: Pulmonary Disease

## 2020-01-28 VITALS — BP 118/78 | HR 80 | Temp 97.7°F | Ht 70.0 in | Wt 199.6 lb

## 2020-01-28 DIAGNOSIS — R0602 Shortness of breath: Secondary | ICD-10-CM

## 2020-01-28 LAB — CBC WITH DIFFERENTIAL/PLATELET
Basophils Absolute: 0.1 10*3/uL (ref 0.0–0.1)
Basophils Relative: 1.1 % (ref 0.0–3.0)
Eosinophils Absolute: 0.4 10*3/uL (ref 0.0–0.7)
Eosinophils Relative: 4.8 % (ref 0.0–5.0)
HCT: 44.3 % (ref 39.0–52.0)
Hemoglobin: 15.1 g/dL (ref 13.0–17.0)
Lymphocytes Relative: 17 % (ref 12.0–46.0)
Lymphs Abs: 1.3 10*3/uL (ref 0.7–4.0)
MCHC: 34 g/dL (ref 30.0–36.0)
MCV: 96.9 fl (ref 78.0–100.0)
Monocytes Absolute: 0.9 10*3/uL (ref 0.1–1.0)
Monocytes Relative: 11.6 % (ref 3.0–12.0)
Neutro Abs: 4.9 10*3/uL (ref 1.4–7.7)
Neutrophils Relative %: 65.5 % (ref 43.0–77.0)
Platelets: 204 10*3/uL (ref 150.0–400.0)
RBC: 4.58 Mil/uL (ref 4.22–5.81)
RDW: 14.3 % (ref 11.5–15.5)
WBC: 7.5 10*3/uL (ref 4.0–10.5)

## 2020-01-28 NOTE — Progress Notes (Signed)
Shadoe Holohan Cassaday    [id number]    15-Jul-1961  Primary Care Physician:Harris, Gwyndolyn Saxon, MD  Referring Physician: Shirline Frees, MD [place],  [place] [address]  Chief complaint: Consult for asthma, chronic rhinosinusitis  HPI: 59 year old with history of allergies, chronic rhinosinusitis, allergies  Was followed by allergist in his teens and was receiving allergy shots but not recently He has history of significant chronic nasal congestion, postnasal discharge and chronic sinus complaints of headache and drainage. He underwent previous septoplasty and balloon sinus plasty with limited improvement in symptoms. CT scan shows diffuse mucosal disease involving the maxillary and ethmoid sinuses bilaterally with extension to the nasal frontal recess.  Has significant issues with chronic sinus congestion, recurrent sinus exacerbations. He is on nasal spray, pseudoephedrine. The only thing that helps is prednisone. He is also on albuterol for his asthma. Not on maintenance inhalers.   Pets: No pets Occupation: Engineer, structural Exposures: No known exposures. No mold, hot tub, Jacuzzi. No down pillows or comforter Smoking history: Never smoker Travel history: No significant travel history Relevant family history: No significant family of lung diseas  Outpatient Encounter Medications as of 01/28/2020  Medication Sig  . albuterol (VENTOLIN HFA) 108 (90 Base) MCG/ACT inhaler INHALE 2 PUFFS EVERY 4 TO 6 HOURS AS NEEDED FOR COUGH OR WHEEZE.  Marland Kitchen azelastine (ASTELIN) 0.1 % nasal spray Place 2 sprays into both nostrils 2 (two) times daily.   Marland Kitchen etodolac (LODINE) 400 MG tablet Take 400 mg by mouth 2 (two) times daily.   . fluticasone (FLONASE) 50 MCG/ACT nasal spray Place 2 sprays into both nostrils daily.  Marland Kitchen loratadine (CLARITIN) 10 MG tablet Take 10 mg by mouth daily.  . metoprolol (LOPRESSOR) 50 MG tablet Take 50 mg by mouth daily.  . Multiple Vitamin  (MULTIVITAMIN) capsule Take 1 capsule by mouth daily.  . pseudoephedrine (SUDOGEST) 30 MG tablet Take 30 mg by mouth every 6 (six) hours as needed.   . testosterone cypionate (DEPOTESTOSTERONE CYPIONATE) 200 MG/ML injection INJECT 0.25ML TWICE WEEKLY INTO THIGH  . [DISCONTINUED] Pseudoephedrine HCl (SUDAFED PO) Take 1 capsule by mouth daily.  . [DISCONTINUED] Azelastine-Fluticasone (DYMISTA) 137-50 MCG/ACT SUSP Use one spray in each nostril twice daily as directed.  . [DISCONTINUED] nebivolol (BYSTOLIC) 5 MG tablet Take 1 tablet (5 mg total) by mouth daily.  . [DISCONTINUED] propranolol (INDERAL) 20 MG tablet Take 1 tablet (20 mg total) by mouth 3 (three) times daily as needed.   No facility-administered encounter medications on file as of 01/28/2020.    Allergies as of 01/28/2020  . (No Known Allergies)    Past Medical History:  Diagnosis Date  . GERD (gastroesophageal reflux disease)   . High blood pressure   . Lumbar herniated disc   . Palpitations     Past Surgical History:  Procedure Laterality Date  . NASAL SEPTUM SURGERY      Family History  Problem Relation Age of Onset  . Cancer Brother   . Heart attack Maternal Uncle   . Cancer Brother   . Heart attack Maternal Uncle     Social History   Socioeconomic History  . Marital status: Married    Spouse name: Not on file  . Number of children: Not on file  . Years of education: Not on file  . Highest education level: Not on file  Occupational History  . Not on file  Tobacco Use  . Smoking status: Never  Smoker  . Smokeless tobacco: Never Used  Substance and Sexual Activity  . Alcohol use: No  . Drug use: No  . Sexual activity: Not on file  Other Topics Concern  . Not on file  Social History Narrative  . Not on file   Social Determinants of Health   Financial Resource Strain:   . Difficulty of Paying Living Expenses:   Food Insecurity:   . Worried About Charity fundraiser in the Last Year:   . Youth worker in the Last Year:   Transportation Needs:   . Film/video editor (Medical):   Marland Kitchen Lack of Transportation (Non-Medical):   Physical Activity:   . Days of Exercise per Week:   . Minutes of Exercise per Session:   Stress:   . Feeling of Stress :   Social Connections:   . Frequency of Communication with Friends and Family:   . Frequency of Social Gatherings with Friends and Family:   . Attends Religious Services:   . Active Member of Clubs or Organizations:   . Attends Archivist Meetings:   Marland Kitchen Marital Status:   Intimate Partner Violence:   . Fear of Current or Ex-Partner:   . Emotionally Abused:   Marland Kitchen Physically Abused:   . Sexually Abused:     Review of systems: Review of Systems  Constitutional: Negative for fever and chills.  HENT: Negative.   Eyes: Negative for blurred vision.  Respiratory: as per HPI  Cardiovascular: Negative for chest pain and palpitations.  Gastrointestinal: Negative for vomiting, diarrhea, blood per rectum. Genitourinary: Negative for dysuria, urgency, frequency and hematuria.  Musculoskeletal: Negative for myalgias, back pain and joint pain.  Skin: Negative for itching and rash.  Neurological: Negative for dizziness, tremors, focal weakness, seizures and loss of consciousness.  Endo/Heme/Allergies: Negative for environmental allergies.  Psychiatric/Behavioral: Negative for depression, suicidal ideas and hallucinations.  All other systems reviewed and are negative.  Physical Exam: Blood pressure 118/78, pulse 80, temperature 97.7 F (36.5 C), temperature source Temporal, height 5\' 10"  (1.778 m), weight 199 lb 9.6 oz (90.5 kg), SpO2 96 %. Gen:      No acute distress HEENT:  EOMI, sclera anicteric Neck:     No masses; no thyromegaly Lungs:    Clear to auscultation bilaterally; normal respiratory effort CV:         Regular rate and rhythm; no murmurs Abd:      + bowel sounds; soft, non-tender; no palpable masses, no distension Ext:     No edema; adequate peripheral perfusion Skin:      Warm and dry; no rash Neuro: alert and oriented x 3 Psych: normal mood and affect  Data Reviewed: Imaging: Chest x-ray 11/12/2013-no acute cardiopulmonary abnormality CT sinus 06/02/2016-chronic and acute sinus disease.  PFTs: Spirometry 06/18/2016 FVC 4.35 [83%], FEV1 3.52 [100%], F/F 18 Normal test   Assessment:  Chronic rhinosinusitis, asthma Patient is interested in [place] therapy His asthma does not appear to be severe and is maintained on albuterol as needed Previous spirometry with no obstruction.  He may qualify for dupixent based on his chronic rhinosinusitis alone.  As he has significant burden of disease, requires frequent prednisone tapers and not responsive to other medications, surgeries. We will check PFTs, CBC differential, respiratory allergy profile, chest x-ray for baseline assessment. Based on these results we will attempt insurance approval.  Plan/Recommendations: Chest x-ray PFTs CBC, respiratory allergy profile  This appointment required 50 minutes of patient care (this includes  precharting, chart review, review of results, face-to-face care, etc.).  Marshell Garfinkel MD [facility] 01/28/2020, 2:22 PM  CC: Shirline Frees, MD

## 2020-01-28 NOTE — Patient Instructions (Signed)
We will check some labs today including CBC with differential, respiratory allergy profile We will schedule you for chest x-ray and pulmonary function test Based on these results we may apply for Dupixent therapy Follow-up in 3 months.

## 2020-01-31 LAB — RESPIRATORY ALLERGY PROFILE REGION II ~~LOC~~

## 2020-01-31 LAB — INTERPRETATION:

## 2020-02-01 MED FILL — FLUTICASONE PROP 50 MCG SPR: 50 | 30 days supply | Qty: 16 | Fill #0

## 2020-02-07 ENCOUNTER — Telehealth: Payer: Self-pay | Admitting: Pulmonary Disease

## 2020-02-07 DIAGNOSIS — J320 Chronic maxillary sinusitis: Secondary | ICD-10-CM | POA: Diagnosis not present

## 2020-02-07 DIAGNOSIS — J309 Allergic rhinitis, unspecified: Secondary | ICD-10-CM | POA: Diagnosis not present

## 2020-02-07 MED FILL — levoFLOXacin 500 MG TABS: 500 | 10 days supply | Qty: 10 | Fill #0

## 2020-02-07 NOTE — Telephone Encounter (Signed)
Marshell Garfinkel, MD  02/02/2020 3:52 PM EDT    CXR is clear Labs show mild elevation in eosinophils  Can we complete the application for dupixent- for indication chronic rhinosinusitis   Attempted to call pt but unable to reach. Left pt a detailed message letting him know the results of the labwork and cxr and that we were going to start application for dupixent. Stated to him that he would need to come by to sign portion of the application.  I am routing this encounter to both [place] since she was going to begin filling out application as well as route this to injection pool.

## 2020-02-08 NOTE — Telephone Encounter (Signed)
ATC patient. LMTCB 02/08/20.

## 2020-02-11 MED FILL — AMOX-CLAV 875-125 MG TABLET: 875-125 | 10 days supply | Qty: 20 | Fill #0

## 2020-02-11 NOTE — Telephone Encounter (Signed)
ATC and there was NA, LMTCB

## 2020-02-12 DIAGNOSIS — U071 COVID-19: Secondary | ICD-10-CM | POA: Diagnosis not present

## 2020-02-14 NOTE — Telephone Encounter (Signed)
Pt has been called x3 with no success in reaching him.   Will route to injection pool to see if the paperwork has been started for Dupixent and what is needed from triage.

## 2020-02-17 ENCOUNTER — Telehealth: Payer: Self-pay | Admitting: Pulmonary Disease

## 2020-02-17 DIAGNOSIS — Z1211 Encounter for screening for malignant neoplasm of colon: Secondary | ICD-10-CM | POA: Diagnosis not present

## 2020-02-17 NOTE — Telephone Encounter (Signed)
Pt came by the office today to sign his portion of Dupixent enrollment forms. Forms have been completed and placed in Dr. Matilde Bash in box up front to be signed. I placed a note on the forms to return them to the pharmacy team to follow through with enrollment process.

## 2020-02-17 NOTE — Telephone Encounter (Signed)
Submitted a Prior Authorization request to [facility] for [place] via Cover My Meds. Will update once we receive a response.  (Key: [id number]) [id number]

## 2020-02-21 NOTE — Telephone Encounter (Signed)
Received a fax regarding Prior Authorization from [facility] for [place]. Authorization has been DENIED because : 1. There is documentation of evidence of nasal polyps by direct examination, endoscopy (using a small camera) or sinus computerized tomography (CT) scan. 2. There is not documentation of evidence of nasal polyps. This request has been denied because we did not receive information that you meet the requirement listed above.Jason Schneider

## 2020-02-22 NOTE — Telephone Encounter (Signed)
PA was not submitted for nasal polyps.  Nasal polyps is a requirement for Dupixent approval for patient's insurance.  No evidence of nasal polyps in patient records  Dr. Vaughan Browner, would you like [place] to try and submit an appeal or look into alternative agents such as Nucala or Fasenra.   Mariella Saa, PharmD, [place], [place] Clinical Specialty Pharmacist [phone]  02/22/2020 11:05 AM

## 2020-02-22 NOTE — Telephone Encounter (Signed)
Diagnosis for PA was Chronic Rhinosinusitis, but it was denied because that diagnosis my be accompanied clinical documentation of Nasal Polys.

## 2020-02-22 NOTE — Telephone Encounter (Signed)
Dupixent isn't being prescribed for nasal polyps.

## 2020-02-23 MED FILL — LEVALBUTEROL 0.63 MG/3 ML S: 0.63 | 4 days supply | Qty: 72 | Fill #0

## 2020-02-25 ENCOUNTER — Encounter: Payer: Self-pay | Admitting: Pharmacist

## 2020-02-25 NOTE — Progress Notes (Signed)
Appeal for [place].

## 2020-02-25 NOTE — Telephone Encounter (Signed)
Submitted Expedited appeal for Dupixent to [place]. Will update once we receive a response.  Fax# [phone] Phone#  [phone]

## 2020-02-28 MED FILL — SUDOGEST 30 MG TABLET: 30 | 25 days supply | Qty: 100 | Fill #3

## 2020-02-29 NOTE — Telephone Encounter (Signed)
Received notification from [facility] regarding a prior authorization for [place]. Authorization has been APPROVED from 02/25/20 to 08/25/20.   Authorization # [id number] Phone # [phone]  Patient must fill through [facility]. He has Pharmacist, community and may use a copay card.  9:44 AM Beatriz Chancellor, CPhT

## 2020-03-01 NOTE — Telephone Encounter (Signed)
Called to notify patient. No answer.  Left voicemail requesting a return call.  He needs to be scheduled for injection training and can courier his medication from [facility].   Mariella Saa, PharmD, [place], [place] Clinical Specialty Pharmacist [phone]  03/01/2020 2:55 PM

## 2020-03-03 ENCOUNTER — Other Ambulatory Visit: Payer: Self-pay | Admitting: Otolaryngology

## 2020-03-03 DIAGNOSIS — J320 Chronic maxillary sinusitis: Secondary | ICD-10-CM

## 2020-03-03 NOTE — Telephone Encounter (Signed)
Left message for patient to call back. Please schedule for in person pharmacy team visit for injection training.

## 2020-03-08 NOTE — Telephone Encounter (Signed)
Left message for patient to call back. Please schedule for in person pharmacy team visit for [place] injection training.

## 2020-03-13 NOTE — Telephone Encounter (Signed)
Patient was scheduled for 03/30/20.  Nothing further needed.  Closing encounter.   Mariella Saa, PharmD, [place], [place] Clinical Specialty Pharmacist [phone]  03/13/2020 1:25 PM

## 2020-03-14 DIAGNOSIS — U071 COVID-19: Secondary | ICD-10-CM | POA: Diagnosis not present

## 2020-03-30 ENCOUNTER — Other Ambulatory Visit: Payer: 59

## 2020-04-09 NOTE — Progress Notes (Deleted)
HPI Patient presents today to [facility] to see pharmacy team for ***.  Past medical history includes ***  Number of hospitalizations in past year: Number of ***COPD/asthma exacerbations in past year:   Respiratory Medications Current: *** Tried in past: *** Patient reports {Adherence challenges yes [id number] challenges","no known adherence challenges"}  OBJECTIVE No Known Allergies  Outpatient Encounter Medications as of 04/12/2020  Medication Sig Note  . albuterol (VENTOLIN HFA) 108 (90 Base) MCG/ACT inhaler INHALE 2 PUFFS EVERY 4 TO 6 HOURS AS NEEDED FOR COUGH OR WHEEZE.   Marland Kitchen azelastine (ASTELIN) 0.1 % nasal spray Place 2 sprays into both nostrils 2 (two) times daily.    Marland Kitchen etodolac (LODINE) 400 MG tablet Take 400 mg by mouth 2 (two) times daily.  06/18/2016: Received from: [facility]  . fluticasone (FLONASE) 50 MCG/ACT nasal spray Place 2 sprays into both nostrils daily.   Marland Kitchen loratadine (CLARITIN) 10 MG tablet Take 10 mg by mouth daily.   . metoprolol (LOPRESSOR) 50 MG tablet Take 50 mg by mouth daily.   . Multiple Vitamin (MULTIVITAMIN) capsule Take 1 capsule by mouth daily.   . pseudoephedrine (SUDOGEST) 30 MG tablet Take 30 mg by mouth every 6 (six) hours as needed.    . testosterone cypionate (DEPOTESTOSTERONE CYPIONATE) 200 MG/ML injection INJECT 0.25ML TWICE WEEKLY INTO THIGH 06/18/2016: Received from: External Pharmacy   No facility-administered encounter medications on file as of 04/12/2020.      There is no immunization history on file for this patient.   PFTs No flowsheet data found.   Eosinophils Most recent blood eosinophil count was *** cells/microL taken on ***.   IgE   Assessment   1. Biologics training (***) . Omalizumab (Xolair) o MOA: IgG monoclonal antibody (recombinant DNA derived) which inhibits IgE binding to the high-affinity IgE receptor on mast cells and basophils.  o Response to therapy: ??? o Side  effects: Anaphylaxis (0.1%) (black boxed warning), injection site reaction (45%), arthralgia (2.9% to 8%), headache (3% to 15%) o Dosing: SubQ every 2-4 weeks based on weight and pretreatment serum IgE o Administration:  1. Doses >150 mg should be divided over more than one injection site (eg, 225 mg or 300 mg administered as two injections, 375 mg administered as three injections) 2.  Do not inject into moles, scars, bruises, tender areas, or broken skin. 3. Injections may take 5 to 10 seconds to administer (solution is slightly viscous). 4. Administer only under direct medical supervision and observe patient for 2 hours after the first 3 injections and 30 minutes after subsequent injections or in accordance with individual institution policies and procedures 5. Recommended injection sites include the upper arm and the front and middle of the thighs. Maryjean Ka Berna Bue) o MOA: A humanized afucosylated, monoclonal antibody (IgG1, kappa) that binds to the alpha subunit of the interleukin-5 receptor. IL-5 is the major cytokine responsible for the growth and differentiation, recruitment, activation, and survival of eosinophils (a cell type associated with inflammation and an important component in the pathogenesis of asthma). o Response to therapy: ??? o Side effects: antibody development (13%), headache (8%), pharyngitis (5%), injection site reaction (2.2%) o Dosing: 30 mg every 4 weeks for the first 3 doses, followed by once every 8 weeks thereafter. o Administration:  1. Administer SubQ into the upper arm, thigh, or abdomen.  2. Solution is clear to opalescent, colorless to slightly yellow, and may contain a few translucent or white to off-white particles; do not use  if cloudy, discolored, or containing large particles . Mepolizumab (Nucala) o MOA: Not fully understood. It does act an interleukin-5 (IL-5) antagonist monoclonal antibody that reduces the production and survival of eosinophils by  blocking the binding of IL-5 to the alpha chain of the receptor complex on the eosinophil cell surface. o Response to therapy: ??? o Side effects: headache (19%), injection site reaxtion (7-15%), antibody development (6%), backache (5%), fatigue (5%) o Dosing: 100-300 mg subQ every 4 weeks o Administration:  1.  For the 300 mg dose, administer as 3 separate 100 mg injections into the upper arm, thigh, or abdomen ?5 cm (~2 inches) apart if >1 injection administered at same site.  2. Do not shake the reconstituted solution as this could lead to product foaming or precipitation. 3. The solution should be clear to opalescent and colorless to pale yellow or pale brown, essentially particle free. Small air bubbles, however, are expected and acceptable. If particulate matter remains in the solution or if the solution appears cloudy or milky, discard the solution.  . Dupilumab (Dupixent) o MOA: a human monoclonal IgG4 antibody that inhibits interleukin-4 and interleukin-13 cytokine-induced responses, including release of proinflammatory cytokines, chemokines, and IgE o Response to therapy: ??? o Side effects: injection site reaction (6-18%), antibody development (5-16%), ophthalmic conjunctivitis (2-16%), transient blood eosinophilia (1-2%) o Dosing: 400-600 mg followed by 200-300 mg subQ every other week o Administration:  1. Do not shake.  2. Do not use if solution is discolored or contains particulate matter or if window on prefilled pen is yellow (indicates pen has been used).  1. Administer as a subcutaneous injection into the thigh or lower abdomen (avoiding areas within 2 inches of navel); caregiver may administer in upper arm.  2. Rotate injection sites, including initial doses (administer 600 mg initial dose as two 300 mg injections at different sites; administer 400 mg initial dose as two 200 mg injections at different sites).   2. Medication Reconciliation  A drug regimen assessment was  performed, including review of allergies, interactions, disease-state management, dosing and immunization history. Medications were reviewed with the patient, including name, instructions, indication, goals of therapy, potential side effects, importance of adherence, and safe use.  Drug interaction(s): ***  3. Immunizations  Patient is indicated for the influenzae, pneumonia, and shingles vaccinations.  PLAN ***  All questions encouraged and answered.  Instructed patient to reach out with any further questions or concerns.  Thank you for allowing pharmacy to participate in this patient's care.  This appointment required *** minutes of patient care (this includes precharting, chart review, review of results, face-to-face care, etc.).

## 2020-04-10 ENCOUNTER — Telehealth: Payer: Self-pay | Admitting: Pulmonary Disease

## 2020-04-10 NOTE — Telephone Encounter (Signed)
lmtcb for pt.  

## 2020-04-11 NOTE — Telephone Encounter (Signed)
Attempted to call pt but unable to reach. Left message for him to return call x2. 

## 2020-04-12 ENCOUNTER — Other Ambulatory Visit: Payer: 59

## 2020-04-12 NOTE — Telephone Encounter (Signed)
Called and spoke with pt letting him know that we still need to keep PFT appt as scheduled as Dr. Vaughan Browner wants to be able to see what his lung function was. Pt verbalized understanding.  While speaking with pt, he stated that he is scheduled for a pharmacy visit on 6/10 which is the day before the PFT and he wants to make sure it is okay to keep that appt due to him supposed to be doing the quarantining process after the covid test before the PFT.  Pt needs to know if he is okay to keep all appts as scheduled or if he needs to reschedule the pharmacy visit. Dr. Vaughan Browner, please advise.

## 2020-04-12 NOTE — Telephone Encounter (Signed)
Left a voicemail for patient to call our office back regarding prior message.  °

## 2020-04-12 NOTE — Telephone Encounter (Signed)
Pt returning call.  [phone].

## 2020-04-13 DIAGNOSIS — U071 COVID-19: Secondary | ICD-10-CM | POA: Diagnosis not present

## 2020-04-13 NOTE — Telephone Encounter (Signed)
Okay to keep pharmacy appointment as long as Covid test was negative

## 2020-04-13 NOTE — Telephone Encounter (Signed)
ATC unable to reach left detailed message on machine per DPR nothing further needed at this time.

## 2020-04-14 MED FILL — AZELASTINE HCL 137 MCG SPRY: 0.1 | 90 days supply | Qty: 60 | Fill #0

## 2020-04-14 MED FILL — SUDOGEST 30 MG TABLET: 30 | 25 days supply | Qty: 100 | Fill #0

## 2020-04-14 MED FILL — ETODOLAC 400 MG TABS: 400 | 90 days supply | Qty: 180 | Fill #0

## 2020-04-14 MED FILL — ALBUTEROL SULFATE HFA 108 (: 108 (90 BAS | 25 days supply | Qty: 18 | Fill #0

## 2020-04-25 ENCOUNTER — Other Ambulatory Visit: Payer: 59

## 2020-04-26 ENCOUNTER — Inpatient Hospital Stay: Admission: RE | Admit: 2020-04-26 | Payer: 59 | Source: Ambulatory Visit

## 2020-04-27 ENCOUNTER — Other Ambulatory Visit: Payer: 59

## 2020-05-11 ENCOUNTER — Ambulatory Visit
Admission: RE | Admit: 2020-05-11 | Discharge: 2020-05-11 | Disposition: A | Discharge status: Home / Self Care | Payer: 59 | Source: Ambulatory Visit | Attending: Otolaryngology | Admitting: Otolaryngology

## 2020-05-11 DIAGNOSIS — J329 Chronic sinusitis, unspecified: Secondary | ICD-10-CM | POA: Diagnosis not present

## 2020-05-11 DIAGNOSIS — J320 Chronic maxillary sinusitis: Secondary | ICD-10-CM

## 2020-05-14 DIAGNOSIS — U071 COVID-19: Secondary | ICD-10-CM | POA: Diagnosis not present

## 2020-05-24 MED FILL — SUDOGEST MAXIMUM STRENGTH 3: 30 | 24 days supply | Qty: 96 | Fill #1

## 2020-05-31 ENCOUNTER — Other Ambulatory Visit: Payer: 59

## 2020-06-13 DIAGNOSIS — U071 COVID-19: Secondary | ICD-10-CM | POA: Diagnosis not present

## 2020-06-19 ENCOUNTER — Other Ambulatory Visit
Admission: RE | Admit: 2020-06-19 | Discharge: 2020-06-19 | Disposition: A | Discharge status: Home / Self Care | Payer: 59 | Source: Ambulatory Visit | Attending: Pulmonary Disease | Admitting: Pulmonary Disease

## 2020-06-19 ENCOUNTER — Other Ambulatory Visit: Payer: Self-pay

## 2020-06-19 DIAGNOSIS — Z01812 Encounter for preprocedural laboratory examination: Secondary | ICD-10-CM | POA: Insufficient documentation

## 2020-06-19 DIAGNOSIS — Z20822 Contact with and (suspected) exposure to covid-19: Secondary | ICD-10-CM | POA: Diagnosis not present

## 2020-06-19 LAB — SARS CORONAVIRUS 2 (TAT 6-24 HRS): SARS Coronavirus 2: NEGATIVE

## 2020-06-22 ENCOUNTER — Other Ambulatory Visit: Payer: Self-pay

## 2020-06-22 ENCOUNTER — Ambulatory Visit (INDEPENDENT_AMBULATORY_CARE_PROVIDER_SITE_OTHER): Payer: 59 | Admitting: Pulmonary Disease

## 2020-06-22 DIAGNOSIS — R0602 Shortness of breath: Secondary | ICD-10-CM

## 2020-06-22 LAB — PULMONARY FUNCTION TEST
DL/VA % pred: 103 %
DL/VA: 4.42 ml/min/mmHg/L
DLCO cor % pred: 102 %
DLCO cor: 28.6 ml/min/mmHg
DLCO unc % pred: 102 %
DLCO unc: 28.6 ml/min/mmHg
FEF 25-75 Post: 2.81 L/sec
FEF 25-75 Pre: 3.13 L/sec
FEF2575-%Change-Post: -10 %
FEF2575-%Pred-Post: 93 %
FEF2575-%Pred-Pre: 103 %
FEV1-%Change-Post: -4 %
FEV1-%Pred-Post: 89 %
FEV1-%Pred-Pre: 94 %
FEV1-Post: 3.28 L
FEV1-Pre: 3.45 L
FEV1FVC-%Change-Post: -5 %
FEV1FVC-%Pred-Pre: 99 %
FEV6-%Change-Post: 0 %
FEV6-%Pred-Post: 98 %
FEV6-%Pred-Pre: 97 %
FEV6-Post: 4.5 L
FEV6-Pre: 4.48 L
FEV6FVC-%Change-Post: 0 %
FEV6FVC-%Pred-Post: 104 %
FEV6FVC-%Pred-Pre: 104 %
FVC-%Change-Post: 0 %
FVC-%Pred-Post: 95 %
FVC-%Pred-Pre: 94 %
FVC-Post: 4.59 L
FVC-Pre: 4.55 L
Post FEV1/FVC ratio: 71 %
Post FEV6/FVC ratio: 100 %
Pre FEV1/FVC ratio: 76 %
Pre FEV6/FVC Ratio: 100 %
RV % pred: 97 %
RV: 2.16 L
TLC % pred: 96 %
TLC: 6.73 L

## 2020-06-22 NOTE — Progress Notes (Signed)
Full PFT performed today. °

## 2020-07-03 DIAGNOSIS — J324 Chronic pansinusitis: Secondary | ICD-10-CM | POA: Diagnosis not present

## 2020-07-03 MED FILL — CEFDINIR 300 MG CAPSULE: 300 | 14 days supply | Qty: 28 | Fill #0

## 2020-07-04 ENCOUNTER — Ambulatory Visit (HOSPITAL_BASED_OUTPATIENT_CLINIC_OR_DEPARTMENT_OTHER): Payer: 59 | Admitting: Pharmacist

## 2020-07-04 ENCOUNTER — Ambulatory Visit: Payer: 59 | Admitting: Pharmacist

## 2020-07-04 ENCOUNTER — Other Ambulatory Visit: Payer: Self-pay

## 2020-07-04 ENCOUNTER — Other Ambulatory Visit: Payer: Self-pay | Admitting: Internal Medicine

## 2020-07-04 DIAGNOSIS — Z7189 Other specified counseling: Secondary | ICD-10-CM

## 2020-07-04 DIAGNOSIS — Z79899 Other long term (current) drug therapy: Secondary | ICD-10-CM

## 2020-07-04 MED ORDER — DUPIXENT 300 MG/2ML ~~LOC~~ SOAJ: 300.0000 mg | SUBCUTANEOUS | 2 refills | Status: DC

## 2020-07-04 NOTE — Patient Instructions (Addendum)
Thank you for meeting with the pharmacy team today!  Below find a summary of what we discussed at your visit: . START Dupixent 300 mg/34ml every 14 days (next dose due on 8/31) . Activate co-pay card and provide info to pharmacy when they call to set up refill. . You will be getting a call from [facility] the [facility] pharmacist. Remember the 5 C's: COUNTER- leave on the counter at least 30 mins but up to overnight to bring medication to room temperature and prevent stinging COLD- Placing something cold (like and ice gel pack or cold water bottle) on the injection site just before cleansing with alcohol may help reduce pain CLARITIN- for the first two weeks of treatment or  the day of, the day before, and the day after injecting to minimize injection site reactions CORTISONE CREAM- apply if injection site is irritated and itching CALL ME- if injection site reaction is bigger than the size of your fist, looks infected, blisters, or develop hives  Call [phone] with any questions or concerns.   Mariella Saa, PharmD, [place], [facility] Clinical Specialty Pharmacist (Rheumatology and Pulmonology)  07/04/2020 3:22 PM

## 2020-07-04 NOTE — Progress Notes (Addendum)
   HPI Patient presents today to [facility] to see pharmacy team for [place] injection training.  Past medical history includes chronic allergies,asthma, chronic rhinosinusitis, GERD.  He is naive to biologics.  NOBJECTIVE No Known Allergies  Outpatient Encounter Medications as of 07/04/2020  Medication Sig Note  . albuterol (VENTOLIN HFA) 108 (90 Base) MCG/ACT inhaler INHALE 2 PUFFS EVERY 4 TO 6 HOURS AS NEEDED FOR COUGH OR WHEEZE.   Marland Kitchen azelastine (ASTELIN) 0.1 % nasal spray Place 2 sprays into both nostrils 2 (two) times daily.    Marland Kitchen etodolac (LODINE) 400 MG tablet Take 400 mg by mouth 2 (two) times daily.  06/18/2016: Received from: [facility]  . fluticasone (FLONASE) 50 MCG/ACT nasal spray Place 2 sprays into both nostrils daily.   Marland Kitchen loratadine (CLARITIN) 10 MG tablet Take 10 mg by mouth daily.   . metoprolol (LOPRESSOR) 50 MG tablet Take 50 mg by mouth daily.   . Multiple Vitamin (MULTIVITAMIN) capsule Take 1 capsule by mouth daily.   . pseudoephedrine (SUDOGEST) 30 MG tablet Take 30 mg by mouth every 6 (six) hours as needed.    . testosterone cypionate (DEPOTESTOSTERONE CYPIONATE) 200 MG/ML injection INJECT 0.25ML TWICE WEEKLY INTO THIGH 06/18/2016: Received from: External Pharmacy   No facility-administered encounter medications on file as of 07/04/2020.      There is no immunization history on file for this patient.   PFTs PFT Results Latest Ref Rng & Units 06/22/2020  FVC-Pre L 4.55  FVC-Predicted Pre % 94  FVC-Post L 4.59  FVC-Predicted Post % 95  Pre FEV1/FVC % % 76  Post FEV1/FCV % % 71  FEV1-Pre L 3.45  FEV1-Predicted Pre % 94  FEV1-Post L 3.28  DLCO uncorrected ml/min/mmHg 28.60  DLCO UNC% % 102  DLCO corrected ml/min/mmHg 28.60  DLCO COR %Predicted % 102  DLVA Predicted % 103  TLC L 6.73  TLC % Predicted % 96  RV % Predicted % 97     Assessment   1. Biologics training (Dupixent)  Counseled patient on purpose, proper use, and  adverse effects of Dupixent including injection site reactions and conjunctivitis.  Discussed various management strategies for injection site reactions including the use of anti-histamines, cortisone cream, and cold compresses.  Dupixent approved through insurance and he is eligible to use co-pay card.  Prescription sent to [facility] for Dupixent 300 mg/62ml every 14 days.  Patient given co-pay card in office with instructions to activate.  Advised patient will get a call from both the specialty pharmacy and [facility].  Patient verbalized understanding.  Demonstrated proper injection technique with Dupixent demo pen.  Patient able to demonstrate proper injection technique using the teach back method.  Patient self injected in the left anterior thigh with:  Sample Medication: Dupixent 300 mg/33ml LOT [id number] Expiration 06/17/2021  Patient tolerated well.  Observed for 30 mins in office for adverse reaction and none noted.    PLAN  Start Dupixent 300 mg/55ml every 14 days  All questions encouraged and answered.  Instructed patient to reach out with any further questions or concerns.  Thank you for allowing pharmacy to participate in this patient's care.  This appointment required 65 minutes of patient care (this includes precharting, chart review, review of results, face-to-face care, etc.).   Mariella Saa, PharmD, [place], CPP Clinical Specialty Pharmacist (Rheumatology and Pulmonology)  07/04/2020 3:44 PM

## 2020-07-04 NOTE — Progress Notes (Signed)
   S: Patient presents for review of their specialty medication therapy.  Patient is currently taking Dupixent for asthma/chronic rhinosinusitis. Patient is managed by Dr. Vaughan Browner for this.   Adherence: received first injection in the Pulmonology clinic today. Pt was counseled by [employer], CPP.   Efficacy: just started therapy   Dosing: 300 mg subq q14days  Dose adjustments: Renal: no dose adjustments (has not been studied) Hepatic: no dose adjustments (has not been studied)  Drug-drug interactions: none identified  Monitoring: S/sx of infection: none  S/sx of hypersensitivity: counseling given S/sx of ocular effects: counseling given S/sx of eosinophilia/vasculitis: counseling given  O: Lab Results  Component Value Date   WBC 7.5 01/28/2020   HGB 15.1 01/28/2020   HCT 44.3 01/28/2020   MCV 96.9 01/28/2020   PLT 204.0 01/28/2020      Chemistry   No results found for: NA, K, CL, CO2, BUN, CREATININE, GLU No results found for: CALCIUM, ALKPHOS, AST, ALT, BILITOT     A/P: 1. Medication review: Patient currently on [place] for asthma/chronic rhinosinusitis. Reviewed the medication with the patient, including the following: Dupixent is a monoclonal antibody used for the treatment of asthma or atopic dermatitis. Patient educated on purpose, proper use and potential adverse effects of Dupixent. Possible adverse effects include increased risk of infection, ocular effects, vasculitis/eosinophilia, and hypersensitivity reactions. Administer as a SubQ injection and rotate sites. Allow the medication to reach room temp prior to administration (45 mins for 300 mg syringe or 30 min for 200 mg syringe). Do not shake. Discard any unused portion. No recommendations for any changes.   Benard Halsted, PharmD, [place] [phone]

## 2020-07-06 NOTE — Telephone Encounter (Signed)
Left message for patient to see if he called to get activated for copay card.

## 2020-07-11 MED FILL — DUPIXENT 300 MG/2ML SOPN: 300 | 28 days supply | Qty: 4 | Fill #0

## 2020-07-12 IMAGING — DX DG CHEST 2V
2 series · 2 of 2 positions shown · non-contrast
Comparison: November 12, 2013

CLINICAL DATA: Shortness of breath.  Recent [id number] positive

EXAM:
CHEST - 2 VIEW

[chest pa]
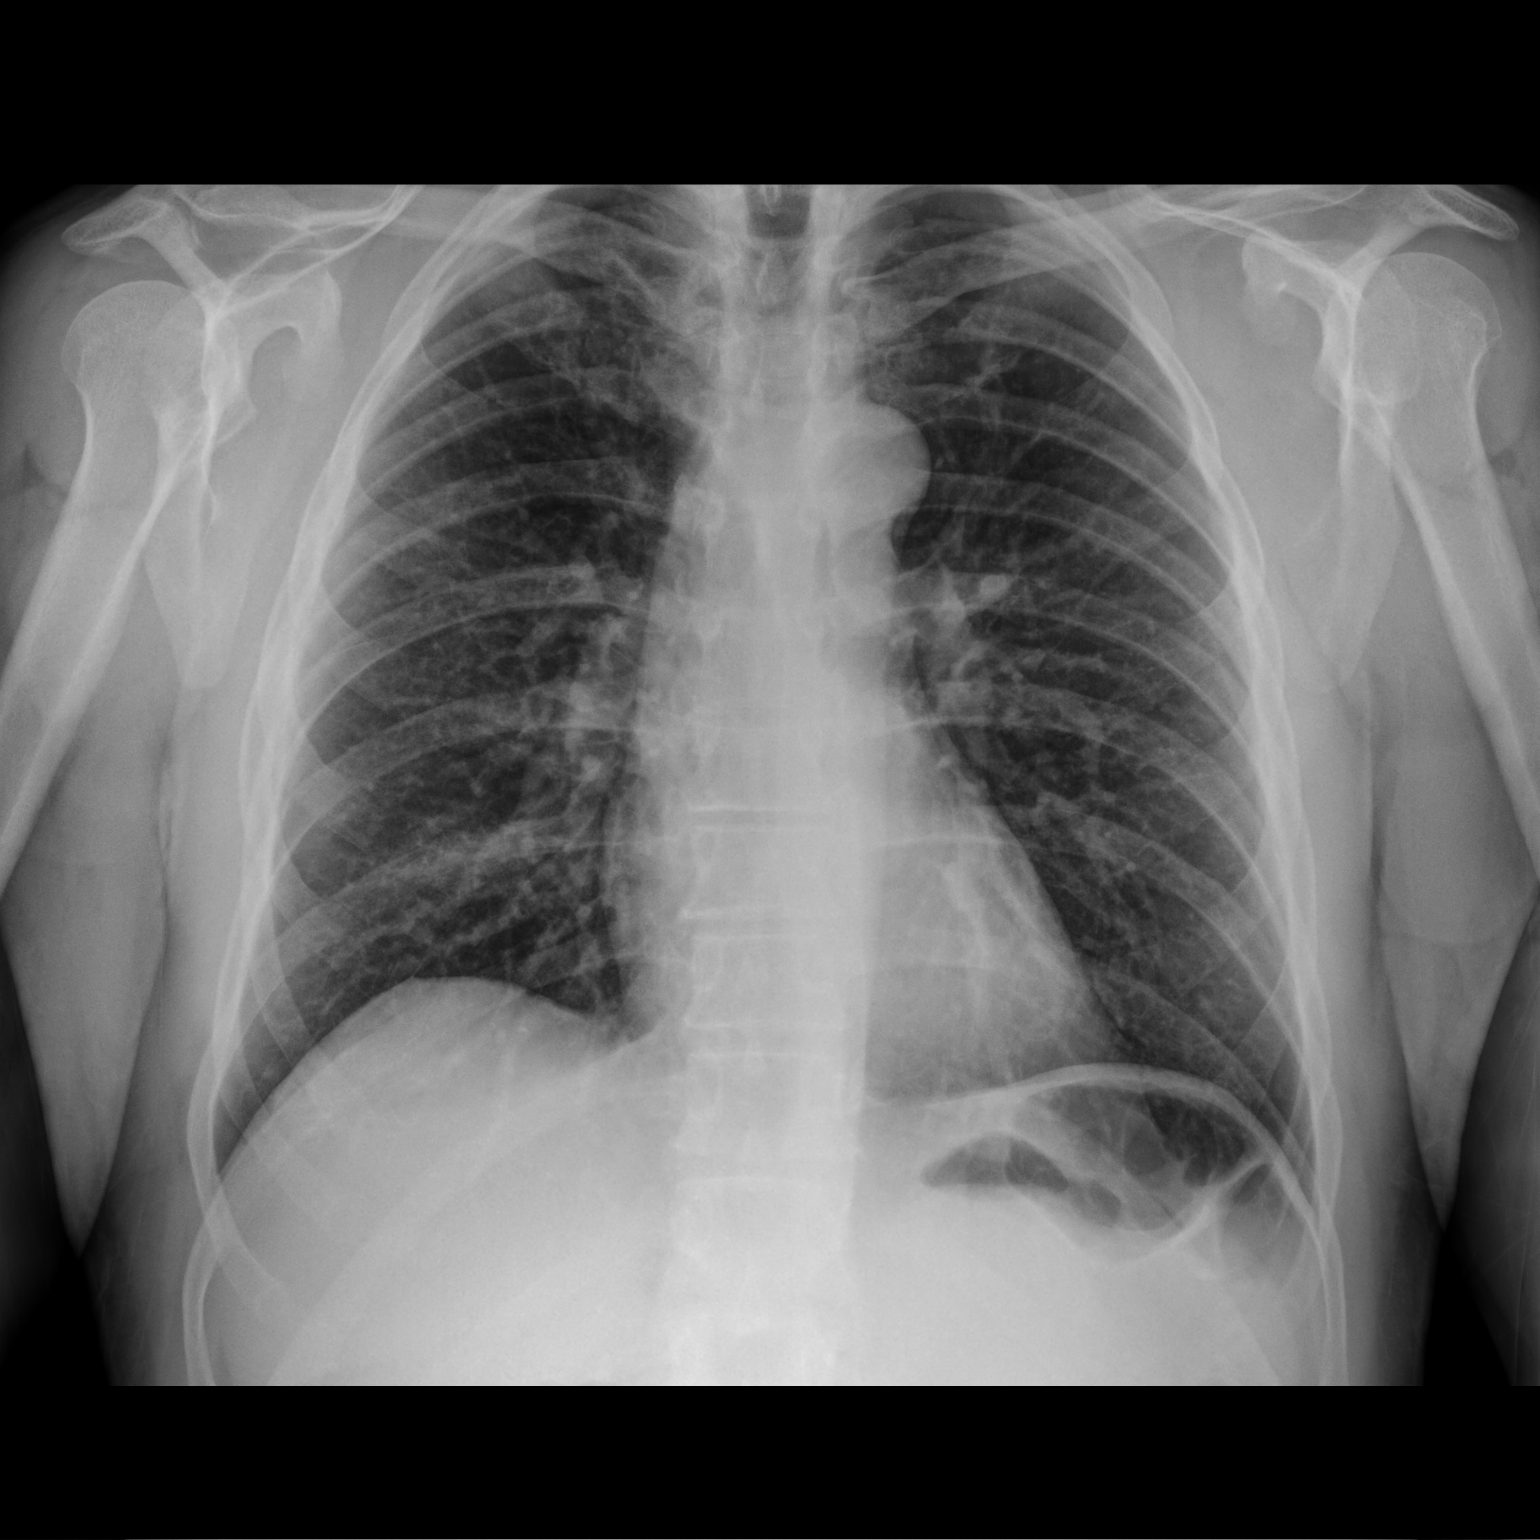

[chest lat]
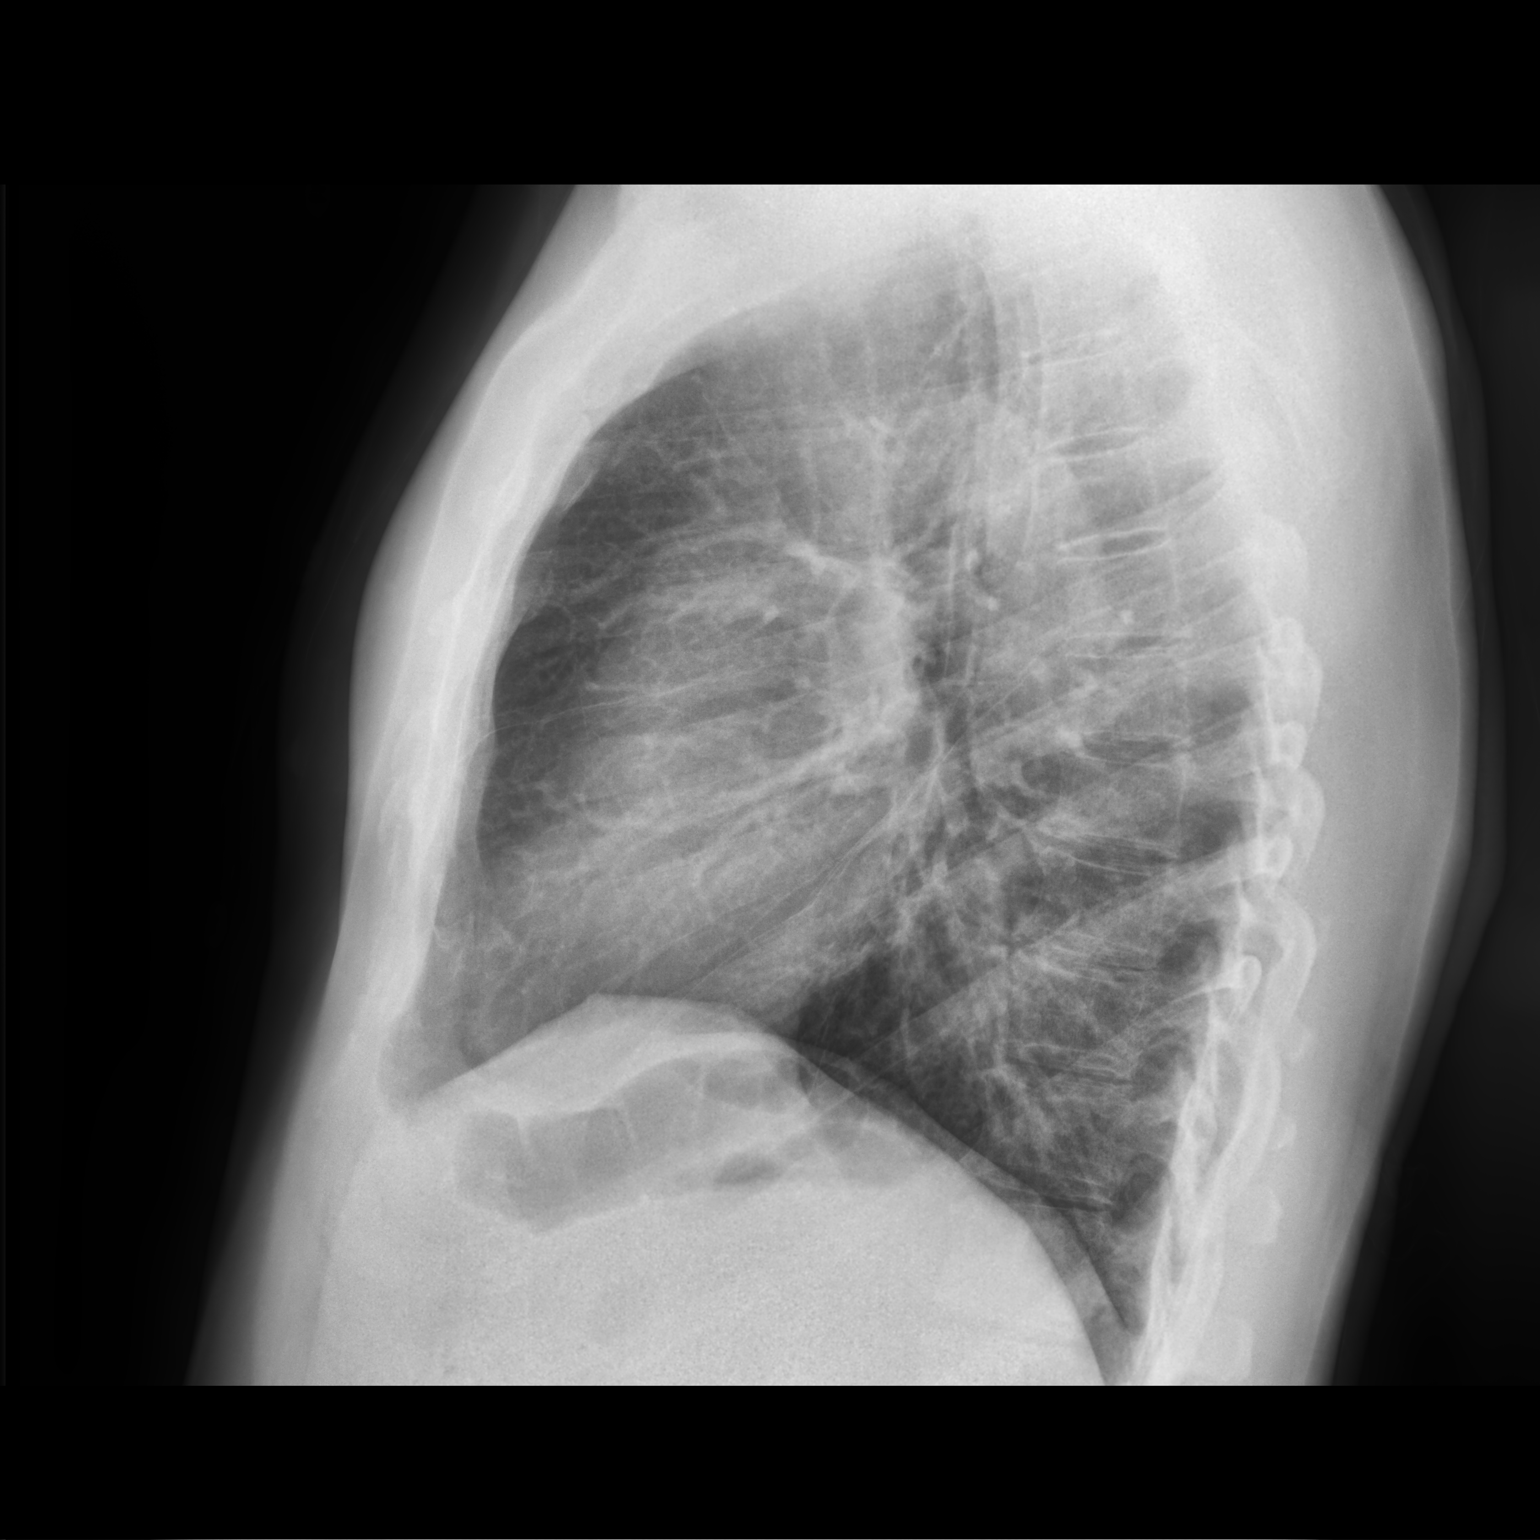

[2 of 2 positions shown; findings below may reference images not displayed]

FINDINGS: The lungs are clear. The heart size and pulmonary vascularity are
normal. No adenopathy. No bone lesions.
IMPRESSION: Lungs clear.  No adenopathy.

## 2020-07-14 DIAGNOSIS — U071 COVID-19: Secondary | ICD-10-CM | POA: Diagnosis not present

## 2020-07-20 ENCOUNTER — Ambulatory Visit: Payer: 59 | Admitting: Pulmonary Disease

## 2020-07-27 MED FILL — SUDOGEST 30 MG TABLET: 30 | 25 days supply | Qty: 100 | Fill #0

## 2020-07-27 MED FILL — ETODOLAC 400 MG TABS: 400 | 90 days supply | Qty: 180 | Fill #0

## 2020-07-27 MED FILL — METOPROLOL TARTRATE 50 MG T: 50 | 90 days supply | Qty: 90 | Fill #0

## 2020-08-11 MED FILL — DUPIXENT 300 MG/2ML SOPN: 300 | 28 days supply | Qty: 4 | Fill #1

## 2020-08-14 DIAGNOSIS — U071 COVID-19: Secondary | ICD-10-CM | POA: Diagnosis not present

## 2020-08-17 DIAGNOSIS — S46012D Strain of muscle(s) and tendon(s) of the rotator cuff of left shoulder, subsequent encounter: Secondary | ICD-10-CM | POA: Diagnosis not present

## 2020-08-17 MED FILL — CELECOXIB 200 MG CAP: 200 | 30 days supply | Qty: 60 | Fill #0

## 2020-08-18 ENCOUNTER — Telehealth: Payer: Self-pay | Admitting: Pharmacy Technician

## 2020-08-18 NOTE — Telephone Encounter (Signed)
CCing the pharmacy team  I believe the dupixent was approved based on Dx of chronic rhinosinusitis

## 2020-08-18 NOTE — Telephone Encounter (Signed)
Plan denied initial Pa with dx of Chronic rhinosinusitis because patient did not have nasal polyps. Pharmacist appealed and changed diagnosis to moderate Asthma.

## 2020-08-18 NOTE — Telephone Encounter (Signed)
Received notice from patient's insurance [employer] that they will not renew patient's Dupixent PA due to chart notes stating that he is not on an ICS or ICS- containing inhaler.  Please advise.

## 2020-08-18 NOTE — Telephone Encounter (Signed)
Please order symbicort 80/4.5 for him and make follow up in clinic

## 2020-08-25 NOTE — Telephone Encounter (Signed)
Noted. Thank you!  Will update chart with coverage dates once approval fax has been received.

## 2020-08-25 NOTE — Telephone Encounter (Signed)
Ivin Booty from [employer] calling to inform the [place] for [place] was approved. Ivin Booty can be reached at [phone]

## 2020-09-06 DIAGNOSIS — Z7689 Persons encountering health services in other specified circumstances: Secondary | ICD-10-CM | POA: Diagnosis not present

## 2020-09-13 DIAGNOSIS — U071 COVID-19: Secondary | ICD-10-CM | POA: Diagnosis not present

## 2020-09-18 MED FILL — DUPIXENT 300 MG/2ML SOPN: 300 | 28 days supply | Qty: 4 | Fill #2

## 2020-09-28 ENCOUNTER — Other Ambulatory Visit (HOSPITAL_COMMUNITY): Payer: Self-pay | Admitting: Family Medicine

## 2020-09-28 MED FILL — ALBUTEROL SULFATE HFA 108 (: 108 (90 BAS | 25 days supply | Qty: 18 | Fill #0

## 2020-09-29 MED FILL — SUDOGEST 30 MG TABLET: 30 | 25 days supply | Qty: 100 | Fill #0

## 2020-10-10 ENCOUNTER — Other Ambulatory Visit (HOSPITAL_COMMUNITY): Payer: Self-pay | Admitting: Family Medicine

## 2020-10-11 ENCOUNTER — Other Ambulatory Visit: Payer: Self-pay | Admitting: Pharmacist

## 2020-10-11 MED FILL — LEVALBUTEROL 0.63 MG/3 ML S: 0.63 | 8 days supply | Qty: 72 | Fill #0

## 2020-10-14 DIAGNOSIS — U071 COVID-19: Secondary | ICD-10-CM | POA: Diagnosis not present

## 2020-10-16 ENCOUNTER — Other Ambulatory Visit: Payer: Self-pay | Admitting: Pharmacist

## 2020-10-16 MED ORDER — DUPIXENT 300 MG/2ML ~~LOC~~ SOAJ: 300.0000 mg | SUBCUTANEOUS | 2 refills | Status: DC

## 2020-10-16 NOTE — Telephone Encounter (Signed)
Routing refill request

## 2020-10-18 MED FILL — DUPIXENT 300 MG/2ML SOPN: 300 | 28 days supply | Qty: 4 | Fill #0

## 2020-10-24 IMAGING — CT CT MAXILLOFACIAL W/O CM
3 of 5 series · 14 of 47 positions shown, 16 images · non-contrast
Comparison: 05/23/2016

CLINICAL DATA: Chronic maxillary sinusitis. Headaches and
congestion. Prior nasal septum surgery.

EXAM:
CT MAXILLOFACIAL WITHOUT CONTRAST
TECHNIQUE: Multidetector CT images of the paranasal sinuses were obtained using
the standard protocol without intravenous contrast.

[Series 4: sinus 2.00 hr60 s3 cor · coronal · 0.28mm/px · 3 of 106 slices shown]
[im 36/106  bone]
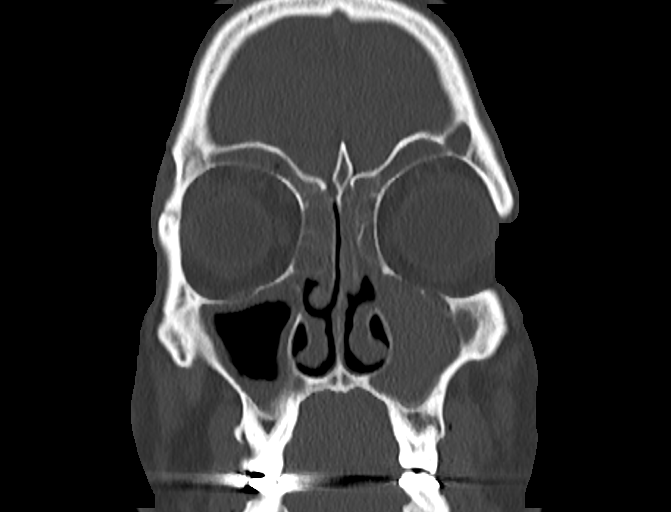
[im 47/106  bone]
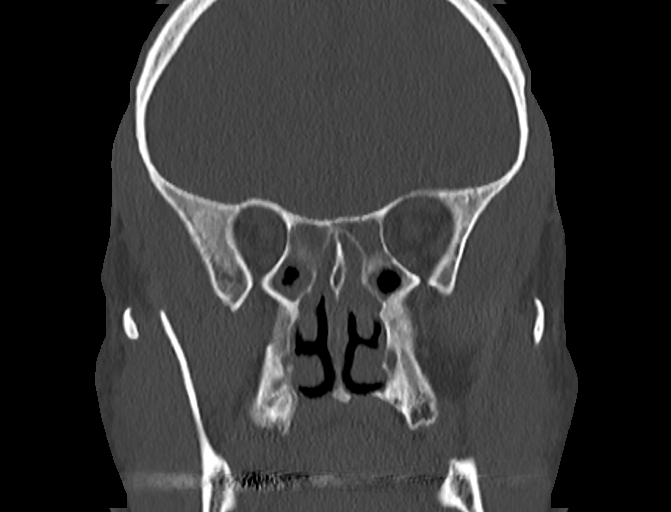
[im 59/106  bone]
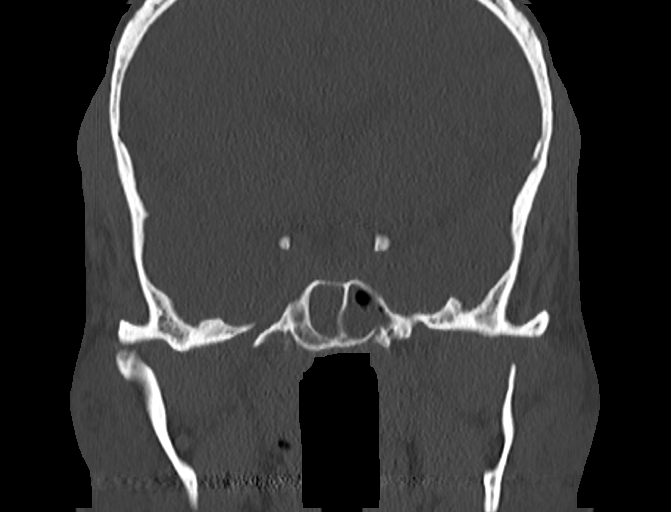

[Series 6: sinus 2.00 hr60 s3 sag · sagittal · 0.28mm/px · 3 of 95 slices shown]
[im 32/95  bone]
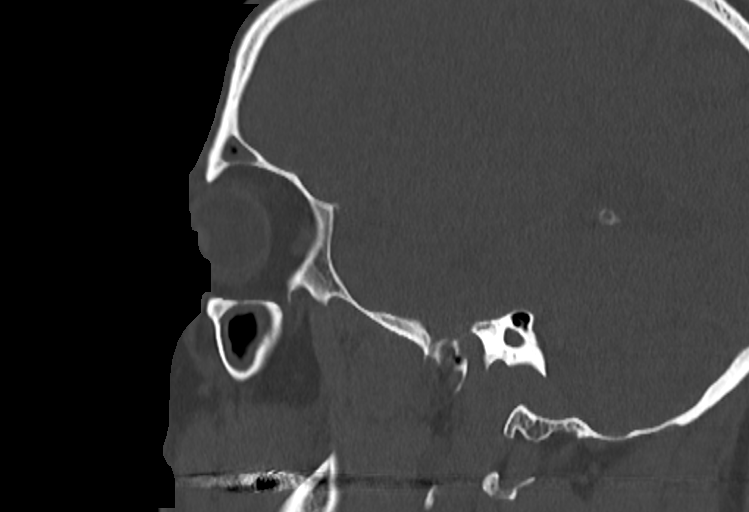
[im 48/95  bone]
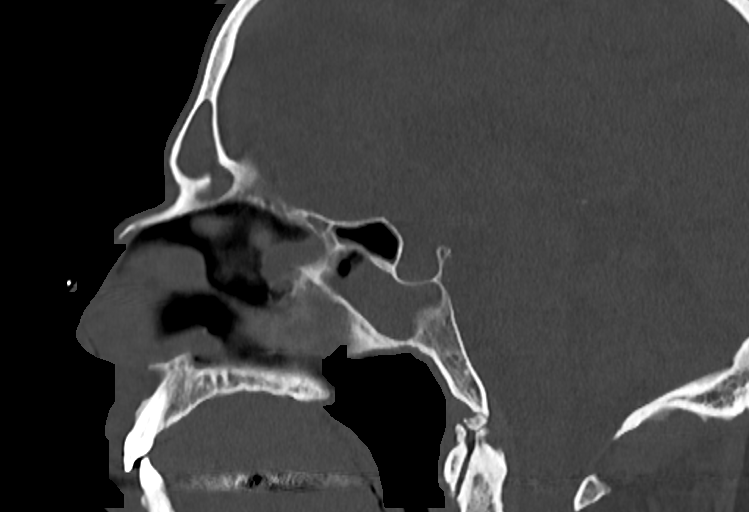
[im 63/95  bone]
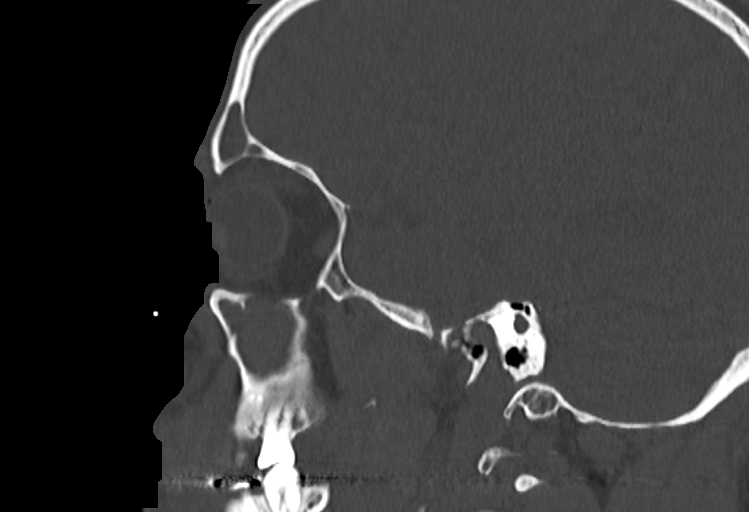

[Series 12: sinus 1.00 hr60 s3 axial fusion thins · axial · 0.41mm/px · z∈[-583,-471]mm · 8 of 241 slices shown, 10 images]
[im 27/241  brain]
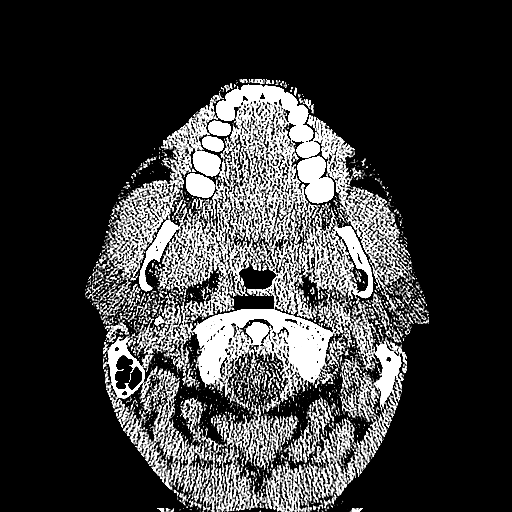
[im 27/241  bone]
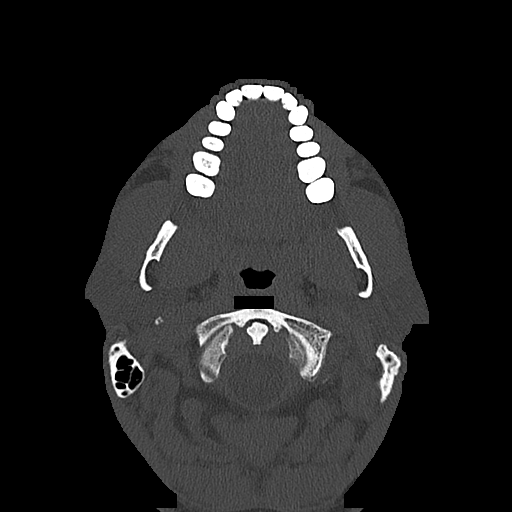
[im 54/241  bone]
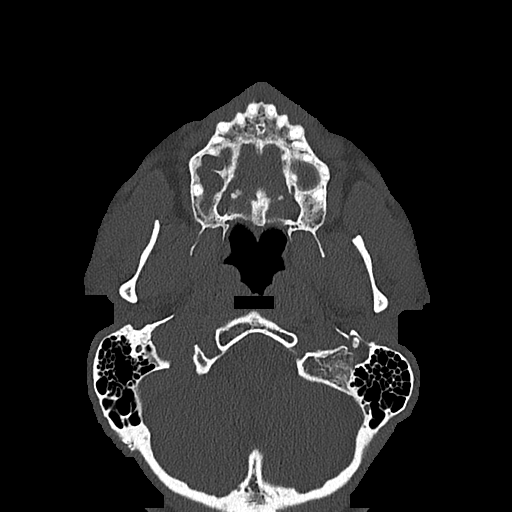
[im 81/241  bone]
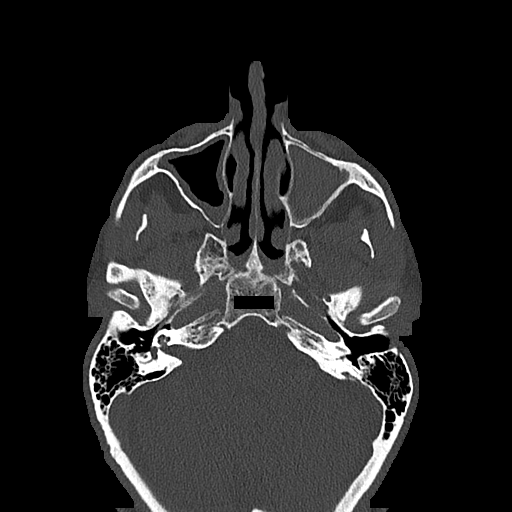
[im 107/241  bone]
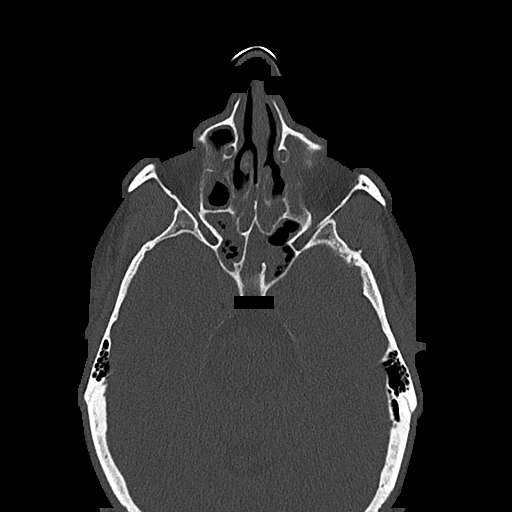
[im 134/241  brain]
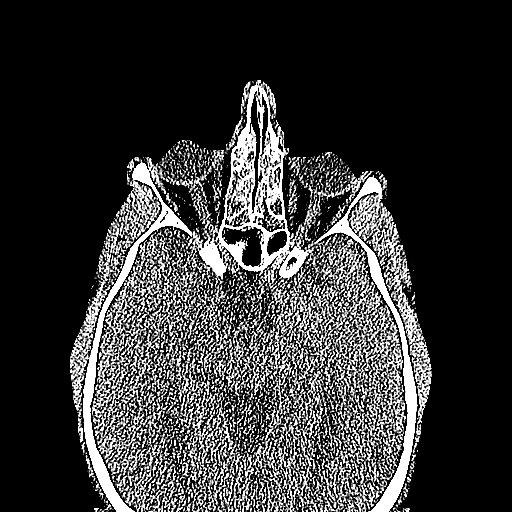
[im 134/241  bone]
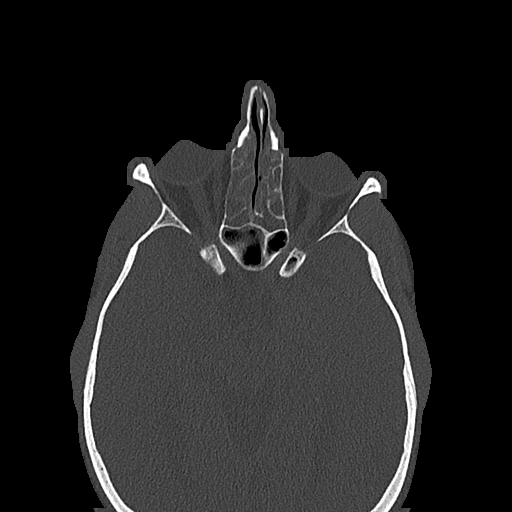
[im 161/241  bone]
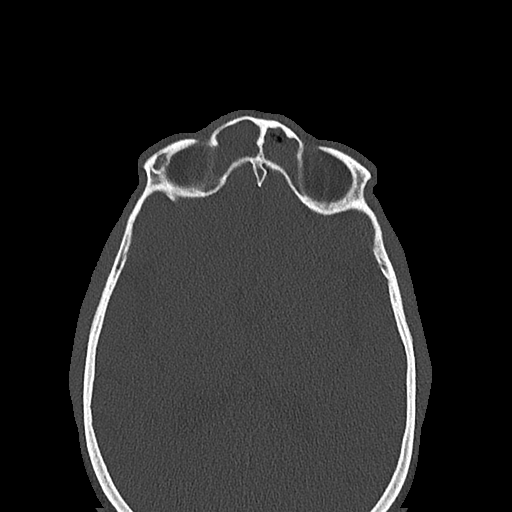
[im 187/241  bone]
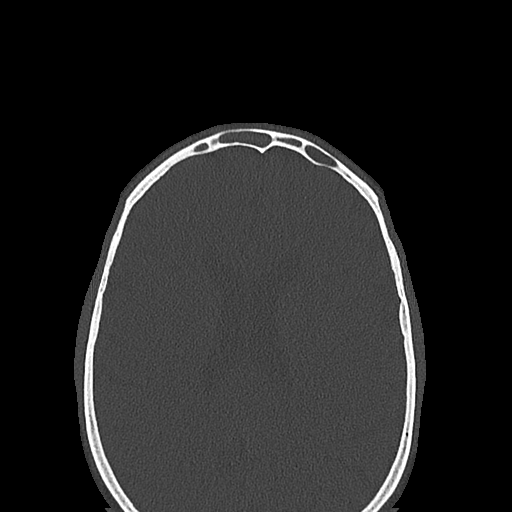
[im 214/241  bone]
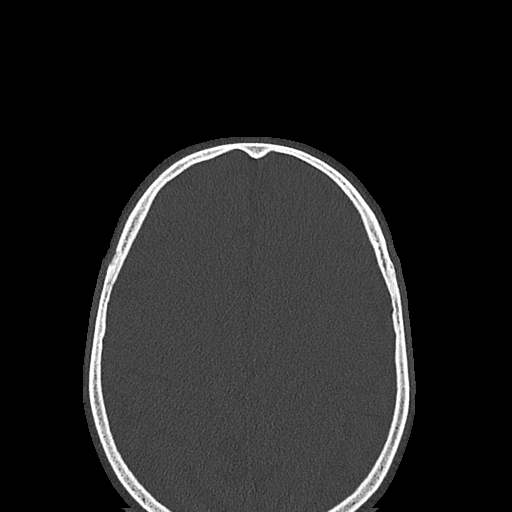

[14 of 47 positions shown; findings below may reference images not displayed]

FINDINGS: Paranasal sinuses:

Frontal: Extensive, subtotal opacification of both frontal sinuses
by a combination of mucosal thickening and fluid. Opacification of
the frontal recesses bilaterally.

Ethmoid: Essentially complete ethmoid air cell opacification
bilaterally.

Maxillary: Complete opacification of the left maxillary sinus by
predominantly mildly hyperattenuating material which may reflect
inspissated secretions or allergic fungal sinusitis. Mild left
maxillary sinus osteitis compatible with chronic sinusitis.
Mild-to-moderate circumferential mucosal thickening and likely small
volume fluid in the right maxillary sinus.

Sphenoid: Moderate mucosal thickening and secretions in both
sphenoid sinuses. Complete opacification of the sphenoethmoidal
recesses bilaterally.

Sinusitis is markedly worse than on the 2084 study.

Right ostiomeatal unit: Completely opacified.

Left ostiomeatal unit: Completely opacified.

Nasal passages: Opacification of the left greater than right
superior nasal cavities. Patent inferior nasal cavities. Intact and
largely midline nasal septum.

Anatomy: No pneumatization superior to anterior ethmoid notches.
Keros I/II. Sellar sphenoid pneumatization pattern. No dehiscence of
carotid or optic canals. No onodi cell.

Other: Clear mastoid air cells and tympanic cavities. Unremarkable
appearance of the orbits and included portion of the brain.
IMPRESSION: Extensive pansinusitis.

## 2020-10-25 DIAGNOSIS — L821 Other seborrheic keratosis: Secondary | ICD-10-CM | POA: Diagnosis not present

## 2020-10-25 DIAGNOSIS — D485 Neoplasm of uncertain behavior of skin: Secondary | ICD-10-CM | POA: Diagnosis not present

## 2020-10-25 DIAGNOSIS — L720 Epidermal cyst: Secondary | ICD-10-CM | POA: Diagnosis not present

## 2020-10-25 DIAGNOSIS — D045 Carcinoma in situ of skin of trunk: Secondary | ICD-10-CM | POA: Diagnosis not present

## 2020-10-31 ENCOUNTER — Ambulatory Visit (INDEPENDENT_AMBULATORY_CARE_PROVIDER_SITE_OTHER): Payer: 59

## 2020-10-31 ENCOUNTER — Encounter: Payer: Self-pay | Admitting: Pulmonary Disease

## 2020-10-31 ENCOUNTER — Other Ambulatory Visit: Payer: Self-pay

## 2020-10-31 ENCOUNTER — Ambulatory Visit (INDEPENDENT_AMBULATORY_CARE_PROVIDER_SITE_OTHER): Payer: 59 | Admitting: Pulmonary Disease

## 2020-10-31 ENCOUNTER — Other Ambulatory Visit: Payer: Self-pay | Admitting: Pulmonary Disease

## 2020-10-31 VITALS — BP 128/72 | HR 88 | Temp 97.6°F | Ht 70.0 in | Wt 202.6 lb

## 2020-10-31 DIAGNOSIS — J4540 Moderate persistent asthma, uncomplicated: Secondary | ICD-10-CM | POA: Diagnosis not present

## 2020-10-31 DIAGNOSIS — R0602 Shortness of breath: Secondary | ICD-10-CM

## 2020-10-31 DIAGNOSIS — R059 Cough, unspecified: Secondary | ICD-10-CM | POA: Diagnosis not present

## 2020-10-31 MED ORDER — BUDESONIDE-FORMOTEROL FUMARATE 80-4.5 MCG/ACT IN AERO
2.0000 | INHALATION_SPRAY | Freq: Two times a day (BID) | RESPIRATORY_TRACT | 5 refills | Status: DC
Start: 2020-10-31 — End: 2020-10-31

## 2020-10-31 MED FILL — SYMBICORT 80-4.5 MCG INH: 80-4.5 | 30 days supply | Qty: 10 | Fill #0

## 2020-10-31 NOTE — Patient Instructions (Signed)
I am glad you are doing well with Dupixent therapy We will start you on Symbicort 80/4.5 2 puffs twice daily Get chest x-ray today  Please make sure that you follow-up with an ophthalmologist and have them send records.  Please also send records from your ENT doctor Follow-up in 3 months.

## 2020-10-31 NOTE — Progress Notes (Signed)
Jason Schneider    [id number]    [birth year]  Primary Care Physician:Harris, Gwyndolyn Saxon, MD  Referring Physician: Shirline Frees, MD [place],  [place] [address]  Chief complaint: Consult for asthma, chronic rhinosinusitis  HPI: 59 year old with history of asthma, chronic rhinosinusitis, allergies  Was followed by allergist in his teens and was receiving allergy shots but not recently He has history of significant chronic nasal congestion, postnasal discharge and chronic sinus complaints of headache and drainage. He underwent previous septoplasty and balloon sinus plasty with limited improvement in symptoms. CT scan shows diffuse mucosal disease involving the maxillary and ethmoid sinuses bilaterally with extension to the nasal frontal recess.  Has significant issues with chronic sinus congestion, recurrent sinus exacerbations. He is on nasal spray, pseudoephedrine. The only thing that helps is prednisone. He is also on albuterol for his asthma. Not on maintenance inhalers.  Pets: No pets Occupation: Engineer, structural Exposures: No known exposures. No mold, hot tub, Jacuzzi. No down pillows or comforter Smoking history: Never smoker Travel history: No significant travel history Relevant family history: No significant family of lung disease  Interim history: Started on Dupixent in April 2021 Reports significant improvement in sinus congestion with the new therapy. Reports increasing dyspnea, chest tightness.   Also notes occasional blurring of vision  We had ordered Symbicort earlier this year but for some reason he has not received it and is just on albuterol Reports seeing ENT who did a scope and noted nasal polyposis.  COVID-19 in January 2021.  Did not require hospitalization and self treated at home.  Outpatient Encounter Medications as of 10/31/2020  Medication Sig  . albuterol (VENTOLIN HFA) 108 (90 Base) MCG/ACT inhaler INHALE 2  PUFFS EVERY 4 TO 6 HOURS AS NEEDED FOR COUGH OR WHEEZE.  Marland Kitchen azelastine (ASTELIN) 0.1 % nasal spray Place 2 sprays into both nostrils 2 (two) times daily.   . Dupilumab (DUPIXENT) 300 MG/2ML SOPN Inject 300 mg into the skin every 14 (fourteen) days.  Marland Kitchen etodolac (LODINE) 400 MG tablet Take 400 mg by mouth 2 (two) times daily.   . fluticasone (FLONASE) 50 MCG/ACT nasal spray Place 2 sprays into both nostrils daily.  Marland Kitchen loratadine (CLARITIN) 10 MG tablet Take 10 mg by mouth daily.  . metoprolol (LOPRESSOR) 50 MG tablet Take 50 mg by mouth daily.  . Multiple Vitamin (MULTIVITAMIN) capsule Take 1 capsule by mouth daily.  . pseudoephedrine (SUDAFED) 30 MG tablet Take 30 mg by mouth every 6 (six) hours as needed.   . testosterone cypionate (DEPOTESTOSTERONE CYPIONATE) 200 MG/ML injection INJECT 0.25ML TWICE WEEKLY INTO THIGH   No facility-administered encounter medications on file as of 10/31/2020.   Physical Exam: Blood pressure 128/72, pulse 88, temperature 97.6 F (36.4 C), temperature source Temporal, height 5\' 10"  (1.778 m), weight 202 lb 9.6 oz (91.9 kg), SpO2 98 %. Gen:      No acute distress HEENT:  EOMI, sclera anicteric Neck:     No masses; no thyromegaly Lungs:    Clear to auscultation bilaterally; normal respiratory effort CV:         Regular rate and rhythm; no murmurs Abd:      + bowel sounds; soft, non-tender; no palpable masses, no distension Ext:    No edema; adequate peripheral perfusion Skin:      Warm and dry; no rash Neuro: alert and oriented x 3 Psych: normal mood and affect  Data Reviewed: Imaging:  Chest x-ray 11/12/2013-no acute cardiopulmonary abnormality CT sinus 06/02/2016-chronic and acute sinus disease. I have reviewed the images personally.  PFTs: Spirometry 06/18/2016 FVC 4.35 [83%], FEV1 3.52 [100%], F/F 18 Normal test  PFTs 06/22/2020 FVC 4.59 [95%], FEV1 3.28 [89%], F/F 71, TLC 6.73 [96%], DLCO 28.60 [102%] Normal tests  Labs: CBC 01/28/2020-WBC 7.5, eos  4.8%, absolute eosinophil count 360 Respiratory profile 01/28/2020-IgE 15, RAST panel-negative  Assessment:  Chronic rhinosinusitis, asthma Improved with Dupixent therapy He needs to be on Symbicort as he has increasing respiratory symptoms with chest tightness Ordered Symbicort 80/4.5  Has blurring of vision which may be related to [place] as steroid side effects of keratitis and conjunctivitis.  I have asked him to follow-up with his ophthalmologist regarding this.  Post COVID-19 Developed COVID-19 in January. PFTs are normal Get chest x-ray today.  Plan/Recommendations: Continue Dupixent Start Symbicort Chest x-ray  Marshell Garfinkel MD [facility] 10/31/2020, 3:42 PM  CC: Shirline Frees, MD

## 2020-11-08 MED FILL — DUPIXENT 300 MG/2ML SOPN: 300 | 28 days supply | Qty: 4 | Fill #1

## 2020-11-13 DIAGNOSIS — U071 COVID-19: Secondary | ICD-10-CM | POA: Diagnosis not present

## 2020-11-23 ENCOUNTER — Other Ambulatory Visit (HOSPITAL_COMMUNITY): Payer: Self-pay | Admitting: Family Medicine

## 2020-11-23 MED FILL — SUDOGEST 30 MG TABLET: 30 | 25 days supply | Qty: 100 | Fill #0

## 2020-11-23 MED FILL — METOPROLOL TARTRATE 50 MG T: 50 | 90 days supply | Qty: 90 | Fill #0

## 2020-11-23 MED FILL — ETODOLAC 400 MG TABS: 400 | 90 days supply | Qty: 180 | Fill #0

## 2020-12-12 MED FILL — DUPIXENT 300 MG/2ML SOPN: 300 | 28 days supply | Qty: 4 | Fill #2

## 2020-12-14 DIAGNOSIS — U071 COVID-19: Secondary | ICD-10-CM | POA: Diagnosis not present

## 2021-01-04 ENCOUNTER — Other Ambulatory Visit: Payer: Self-pay | Admitting: Pharmacist

## 2021-01-04 ENCOUNTER — Other Ambulatory Visit: Payer: Self-pay | Admitting: Pulmonary Disease

## 2021-01-04 MED ORDER — DUPIXENT 300 MG/2ML ~~LOC~~ SOSY: PREFILLED_SYRINGE | SUBCUTANEOUS | 2 refills | Status: DC

## 2021-01-10 ENCOUNTER — Telehealth: Payer: Self-pay | Admitting: *Deleted

## 2021-01-10 MED FILL — DUPIXENT 300 MG/2 ML SAFE S: 300 | 28 days supply | Qty: 4 | Fill #0

## 2021-01-10 NOTE — Telephone Encounter (Signed)
Message routed to Pharmacy Team

## 2021-01-10 NOTE — Telephone Encounter (Signed)
PA request was received from (pharmacy): [facility] outpatient pharmacy Phone:[phone] Fax: [phone] Medication name and strength: Dupixent 300mg /2mg  Ordering Provider: Dr. Vaughan Browner  Was PA started with [facility]?: yes If yes, please enter KEY: [id number]   PA sent to plan, time frame for approval / denial: Jason Schneider (Key: [id number])  This request has been approved.  Please note any additional information provided by [employer] at the bottom of your screen. Routing to [place] for follow-up

## 2021-01-10 NOTE — Telephone Encounter (Signed)
Pa was approved through 01/09/22. Notified pharmacy to fill rx.

## 2021-01-14 DIAGNOSIS — U071 COVID-19: Secondary | ICD-10-CM | POA: Diagnosis not present

## 2021-01-16 DIAGNOSIS — L821 Other seborrheic keratosis: Secondary | ICD-10-CM | POA: Diagnosis not present

## 2021-01-22 ENCOUNTER — Other Ambulatory Visit (HOSPITAL_COMMUNITY): Payer: Self-pay | Admitting: Otolaryngology

## 2021-01-22 DIAGNOSIS — J324 Chronic pansinusitis: Secondary | ICD-10-CM | POA: Diagnosis not present

## 2021-01-22 DIAGNOSIS — J310 Chronic rhinitis: Secondary | ICD-10-CM | POA: Diagnosis not present

## 2021-01-22 DIAGNOSIS — J3489 Other specified disorders of nose and nasal sinuses: Secondary | ICD-10-CM | POA: Diagnosis not present

## 2021-01-22 DIAGNOSIS — J343 Hypertrophy of nasal turbinates: Secondary | ICD-10-CM | POA: Diagnosis not present

## 2021-01-22 MED FILL — CEFDINIR 300 MG CAPSULE: 300 | 14 days supply | Qty: 28 | Fill #0

## 2021-01-24 ENCOUNTER — Other Ambulatory Visit (HOSPITAL_COMMUNITY): Payer: Self-pay | Admitting: Family Medicine

## 2021-01-24 MED FILL — ALBUTEROL SULFATE HFA 108 (: 108 (90 BAS | 25 days supply | Qty: 18 | Fill #0

## 2021-01-24 MED FILL — SUDOGEST 30 MG TABLET: 30 | 25 days supply | Qty: 100 | Fill #1

## 2021-01-24 MED FILL — SYMBICORT 80-4.5 MCG INH: 80-4.5 | 30 days supply | Qty: 10 | Fill #1

## 2021-01-25 ENCOUNTER — Other Ambulatory Visit: Payer: Self-pay | Admitting: Otolaryngology

## 2021-01-26 ENCOUNTER — Other Ambulatory Visit: Payer: Self-pay | Admitting: Otolaryngology

## 2021-01-26 DIAGNOSIS — J324 Chronic pansinusitis: Secondary | ICD-10-CM

## 2021-01-30 ENCOUNTER — Ambulatory Visit: Payer: 59 | Admitting: Pulmonary Disease

## 2021-02-07 MED FILL — DUPIXENT 300 MG/2 ML SAFE S: 300 | 28 days supply | Qty: 4 | Fill #1

## 2021-02-11 DIAGNOSIS — U071 COVID-19: Secondary | ICD-10-CM | POA: Diagnosis not present

## 2021-02-13 DIAGNOSIS — L821 Other seborrheic keratosis: Secondary | ICD-10-CM | POA: Diagnosis not present

## 2021-02-15 ENCOUNTER — Other Ambulatory Visit (HOSPITAL_COMMUNITY): Payer: Self-pay

## 2021-02-19 DIAGNOSIS — H52223 Regular astigmatism, bilateral: Secondary | ICD-10-CM | POA: Diagnosis not present

## 2021-02-19 DIAGNOSIS — H524 Presbyopia: Secondary | ICD-10-CM | POA: Diagnosis not present

## 2021-02-19 DIAGNOSIS — H2513 Age-related nuclear cataract, bilateral: Secondary | ICD-10-CM | POA: Diagnosis not present

## 2021-02-19 DIAGNOSIS — H5203 Hypermetropia, bilateral: Secondary | ICD-10-CM | POA: Diagnosis not present

## 2021-02-21 DIAGNOSIS — Z1589 Genetic susceptibility to other disease: Secondary | ICD-10-CM | POA: Diagnosis not present

## 2021-02-21 DIAGNOSIS — R251 Tremor, unspecified: Secondary | ICD-10-CM | POA: Diagnosis not present

## 2021-02-21 DIAGNOSIS — F419 Anxiety disorder, unspecified: Secondary | ICD-10-CM | POA: Diagnosis not present

## 2021-02-21 DIAGNOSIS — R7989 Other specified abnormal findings of blood chemistry: Secondary | ICD-10-CM | POA: Diagnosis not present

## 2021-02-21 DIAGNOSIS — R7309 Other abnormal glucose: Secondary | ICD-10-CM | POA: Diagnosis not present

## 2021-02-21 DIAGNOSIS — R5383 Other fatigue: Secondary | ICD-10-CM | POA: Diagnosis not present

## 2021-02-21 DIAGNOSIS — R7982 Elevated C-reactive protein (CRP): Secondary | ICD-10-CM | POA: Diagnosis not present

## 2021-02-21 DIAGNOSIS — E559 Vitamin D deficiency, unspecified: Secondary | ICD-10-CM | POA: Diagnosis not present

## 2021-02-21 DIAGNOSIS — G4700 Insomnia, unspecified: Secondary | ICD-10-CM | POA: Diagnosis not present

## 2021-02-26 ENCOUNTER — Other Ambulatory Visit (HOSPITAL_COMMUNITY): Payer: Self-pay

## 2021-03-01 ENCOUNTER — Other Ambulatory Visit (HOSPITAL_COMMUNITY): Payer: Self-pay

## 2021-03-01 MED FILL — Dupilumab Subcutaneous Soln Prefilled Syringe 300 MG/2ML: SUBCUTANEOUS | 28 days supply | Qty: 4 | Fill #0 | Status: AC

## 2021-03-08 ENCOUNTER — Other Ambulatory Visit (HOSPITAL_COMMUNITY): Payer: Self-pay

## 2021-03-08 MED ORDER — ALBUTEROL SULFATE HFA 108 (90 BASE) MCG/ACT IN AERS
2.0000 | INHALATION_SPRAY | Freq: Four times a day (QID) | RESPIRATORY_TRACT | 1 refills | Status: DC | PRN
  Filled 2021-03-08: qty 18, 25d supply, fill #0

## 2021-03-08 MED ORDER — ETODOLAC 400 MG PO TABS
400.0000 mg | ORAL_TABLET | Freq: Two times a day (BID) | ORAL | 0 refills | Status: DC
  Filled 2021-03-08: qty 180, 90d supply, fill #0

## 2021-03-09 ENCOUNTER — Other Ambulatory Visit (HOSPITAL_COMMUNITY): Payer: Self-pay

## 2021-03-09 MED ORDER — CLOTRIMAZOLE-BETAMETHASONE 1-0.05 % EX CREA
1.0000 "application " | TOPICAL_CREAM | Freq: Two times a day (BID) | CUTANEOUS | 0 refills | Status: DC
  Filled 2021-03-09: qty 15, 14d supply, fill #0

## 2021-03-12 ENCOUNTER — Other Ambulatory Visit (HOSPITAL_COMMUNITY): Payer: Self-pay

## 2021-03-12 MED FILL — Pseudoephedrine HCl Tab 30 MG: ORAL | 25 days supply | Qty: 100 | Fill #0 | Status: CN

## 2021-03-13 ENCOUNTER — Other Ambulatory Visit (HOSPITAL_COMMUNITY): Payer: Self-pay

## 2021-03-14 DIAGNOSIS — U071 COVID-19: Secondary | ICD-10-CM | POA: Diagnosis not present

## 2021-03-15 ENCOUNTER — Other Ambulatory Visit (HOSPITAL_COMMUNITY): Payer: Self-pay

## 2021-03-15 ENCOUNTER — Telehealth: Payer: Self-pay | Admitting: Pulmonary Disease

## 2021-03-15 MED ORDER — PSEUDOEPHEDRINE HCL 30 MG PO TABS
30.0000 mg | ORAL_TABLET | Freq: Four times a day (QID) | ORAL | 0 refills | Status: DC
  Filled 2021-03-15: qty 100, 25d supply, fill #0

## 2021-03-16 ENCOUNTER — Ambulatory Visit: Payer: 59 | Admitting: Pulmonary Disease

## 2021-03-20 ENCOUNTER — Other Ambulatory Visit (HOSPITAL_COMMUNITY): Payer: Self-pay

## 2021-03-20 NOTE — Telephone Encounter (Signed)
lmtcb for pt to schedule OV with Dr. Vaughan Browner.

## 2021-03-21 ENCOUNTER — Other Ambulatory Visit (HOSPITAL_COMMUNITY): Payer: Self-pay

## 2021-03-21 MED FILL — Pseudoephedrine HCl Tab 30 MG: ORAL | 25 days supply | Qty: 100 | Fill #0 | Status: AC

## 2021-03-21 NOTE — Telephone Encounter (Signed)
Can reschedule at next available

## 2021-03-21 NOTE — Telephone Encounter (Signed)
Right now there is an opening with Dr Vaughan Browner for 04/02/21 at 9:15 am.- LMTCB- when pt calls back schedule appt with Dr Vaughan Browner next opening.

## 2021-03-26 NOTE — Telephone Encounter (Signed)
Pt has been scheduled for 05/02/21 with Dr. Vaughan Browner. Nothing further needed at this time.

## 2021-03-29 ENCOUNTER — Other Ambulatory Visit (HOSPITAL_COMMUNITY): Payer: Self-pay

## 2021-04-02 ENCOUNTER — Other Ambulatory Visit (HOSPITAL_COMMUNITY): Payer: Self-pay

## 2021-04-04 ENCOUNTER — Other Ambulatory Visit (HOSPITAL_COMMUNITY): Payer: Self-pay

## 2021-04-09 ENCOUNTER — Other Ambulatory Visit (HOSPITAL_COMMUNITY): Payer: Self-pay

## 2021-04-13 DIAGNOSIS — U071 COVID-19: Secondary | ICD-10-CM | POA: Diagnosis not present

## 2021-04-18 ENCOUNTER — Telehealth: Payer: Self-pay | Admitting: Pulmonary Disease

## 2021-04-18 ENCOUNTER — Other Ambulatory Visit (HOSPITAL_COMMUNITY): Payer: Self-pay

## 2021-04-18 MED ORDER — LEVALBUTEROL HCL 0.63 MG/3ML IN NEBU
0.6300 mg | INHALATION_SOLUTION | Freq: Three times a day (TID) | RESPIRATORY_TRACT | 6 refills | Status: DC | PRN
  Filled 2021-04-18: qty 75, 9d supply, fill #0

## 2021-04-18 NOTE — Telephone Encounter (Signed)
Spoke to patient regarding tremors. He states his last Dupixent dose was 3-4 weeks ago and his tremors have resolved since then. He states he does not see any difference in his breathing since stopping but Dupixent worked well for his sinus symptoms.  I discussed that tremors are not listed as adverse effect of Dupixent and could not find any data suggesting this. We also discussed that Dr. Vaughan Browner may instead suggest another biologic medication for his asthma instead of a lower dose of Dupixent.  Routing to Dr. Vaughan Browner as Juluis Rainier - patient holding [place] until f/u on 05/03/21 where he will discuss further   Knox Saliva, PharmD, MPH Clinical Pharmacist (Rheumatology and Pulmonology)

## 2021-04-20 ENCOUNTER — Other Ambulatory Visit (HOSPITAL_COMMUNITY): Payer: Self-pay

## 2021-05-02 ENCOUNTER — Telehealth: Payer: Self-pay | Admitting: Pharmacist

## 2021-05-02 ENCOUNTER — Ambulatory Visit (INDEPENDENT_AMBULATORY_CARE_PROVIDER_SITE_OTHER): Payer: 59 | Admitting: Pulmonary Disease

## 2021-05-02 ENCOUNTER — Other Ambulatory Visit (HOSPITAL_COMMUNITY): Payer: Self-pay

## 2021-05-02 ENCOUNTER — Other Ambulatory Visit: Payer: Self-pay

## 2021-05-02 DIAGNOSIS — J4540 Moderate persistent asthma, uncomplicated: Secondary | ICD-10-CM

## 2021-05-02 NOTE — Patient Instructions (Signed)
We will attempt Dupixent at a lower dose.  I will contact pharmacy to see if he can get this approved.  Follow-up in 3 to 6 months

## 2021-05-02 NOTE — Telephone Encounter (Addendum)
Please start Dupixent 200mg  sq pre-filled syringe BIV.  Dose: 200mg  every 14 days  Dx: J45.50 (moderate persistent asthma)  Current regimen: Symbicort, fluticasone nasal spray, azelastine nasal spray  Knox Saliva, PharmD, MPH Clinical Pharmacist (Rheumatology and Pulmonology)  Message from Marshell Garfinkel, MD sent at 05/02/2021 10:59 AM EDT ----- Lets attempt Dupixent 200 mg every other week

## 2021-05-02 NOTE — Progress Notes (Signed)
Dupixent 200mg  sq pre-filled syringe every 14 days BIV started in new encounter

## 2021-05-02 NOTE — Progress Notes (Signed)
Jason Schneider    [id number]    08/22/1961  Primary Care Physician:Harris, Gwyndolyn Saxon, MD  Referring Physician: Shirline Frees, MD [place],  [place] [address]  Virtual Visit via Telephone Note  I connected with Tyberius Ryner Katen on 05/02/21 at 10:30 AM EDT by telephone and verified that I am speaking with the correct person using two identifiers.  Location: Patient: Home Provider: [facility], [place], [place]   I discussed the limitations, risks, security and privacy concerns of performing an evaluation and management service by telephone and the availability of in person appointments. I also discussed with the patient that there may be a patient responsible charge related to this service. The patient expressed understanding and agreed to proceed.  Chief complaint: Consult for asthma, chronic rhinosinusitis  HPI: 60 year old with history of asthma, chronic rhinosinusitis, allergies  Was followed by allergist in his teens and was receiving allergy shots but not recently He has history of significant chronic nasal congestion, postnasal discharge and chronic sinus complaints of headache and drainage. He underwent previous septoplasty and balloon sinus plasty with limited improvement in symptoms. CT scan shows diffuse mucosal disease involving the maxillary and ethmoid sinuses bilaterally with extension to the nasal frontal recess.  Has significant issues with chronic sinus congestion, recurrent sinus exacerbations. He is on nasal spray, pseudoephedrine. The only thing that helps is prednisone. He is also on albuterol for his asthma. Not on maintenance inhalers.  Started on [place] in April 2021, has occasional blurring of vision.  Followed up with ophthalmology Reports seeing ENT who did a scope and noted nasal polyposis. COVID-19 in January 2021.  Did not require hospitalization and self treated at home.  Pets: No  pets Occupation: Engineer, structural Exposures: No known exposures. No mold, hot tub, Jacuzzi. No down pillows or comforter Smoking history: Never smoker Travel history: No significant travel history Relevant family history: No significant family of lung disease  Interim history: Reports developing tremors which he attributes to [place].  He stopped the Dupixent 2 months ago and reports an improvement in tremors but rhinosinusitis and asthma symptoms are worse Continues on Symbicort inhaler   Outpatient Encounter Medications as of 05/02/2021  Medication Sig   albuterol (VENTOLIN HFA) 108 (90 Base) MCG/ACT inhaler INHALE 2 PUFFS BY MOUTH EVERY 6 HOURS AS NEEDED   azelastine (ASTELIN) 0.1 % nasal spray Place 2 sprays into both nostrils 2 (two) times daily.    budesonide-formoterol (SYMBICORT) 80-4.5 MCG/ACT inhaler INHALE 2 PUFFS INTO THE LUNGS IN THE MORNING AND AT BEDTIME.   clotrimazole-betamethasone (LOTRISONE) cream Apply 1 application topically to affected area 2 (two) times daily for 14 days   etodolac (LODINE) 400 MG tablet TAKE 1 TABLET BY MOUTH TWO TIMES A DAY   fluticasone (FLONASE) 50 MCG/ACT nasal spray Place 2 sprays into both nostrils daily.   levalbuterol (XOPENEX) 0.63 MG/3ML nebulizer solution INHALE 1 VIAL BY NEBULIZER EVERY 8 HOURS AS NEEDED FOR WHEEZING.   loratadine (CLARITIN) 10 MG tablet Take 10 mg by mouth daily.   metoprolol (LOPRESSOR) 50 MG tablet Take 50 mg by mouth daily.   Multiple Vitamin (MULTIVITAMIN) capsule Take 1 capsule by mouth daily.   pseudoephedrine (SUDAFED) 30 MG tablet Take 30 mg by mouth every 6 (six) hours as needed.    testosterone cypionate (DEPOTESTOSTERONE CYPIONATE) 200 MG/ML injection INJECT 0.25ML TWICE WEEKLY INTO THIGH   dupilumab (DUPIXENT) 300 MG/2ML prefilled syringe  INJECT 300 MG INTO THE SKIN EVERY 14 (FOURTEEN) DAYS. (Patient not taking: Reported on 05/02/2021)   [DISCONTINUED] albuterol (VENTOLIN HFA) 108 (90 Base) MCG/ACT  inhaler INHALE 2 PUFFS EVERY 4 TO 6 HOURS AS NEEDED FOR COUGH OR WHEEZE.   [DISCONTINUED] albuterol (VENTOLIN HFA) 108 (90 Base) MCG/ACT inhaler INHALE 2 PUFFS INTO THE LUNGS EVERY 6 HOURS AS NEEDED   [DISCONTINUED] albuterol (VENTOLIN HFA) 108 (90 Base) MCG/ACT inhaler Inhale 2 puffs into the lungs every 6 (six) hours as needed.   [DISCONTINUED] cefdinir (OMNICEF) 300 MG capsule TAKE 1 CAPSULE BY MOUTH TWICE DAILY   [DISCONTINUED] etodolac (LODINE) 400 MG tablet Take 400 mg by mouth 2 (two) times daily.    [DISCONTINUED] etodolac (LODINE) 400 MG tablet Take 1 tablet (400 mg total) by mouth 2 (two) times daily.   [DISCONTINUED] levalbuterol (XOPENEX) 0.63 MG/3ML nebulizer solution Take 3 mLs (0.63 mg total) by nebulization every 8 (eight) hours as needed for wheezing.   [DISCONTINUED] metoprolol tartrate (LOPRESSOR) 50 MG tablet TAKE 1/2 TABLET BY MOUTH TWO TIMES A DAY   [DISCONTINUED] pseudoephedrine (SUDAFED) 30 MG tablet TAKE 1 TABLET BY MOUTH EVERY 6 HOURS AS NEEDED   [DISCONTINUED] pseudoephedrine (SUDAFED) 30 MG tablet TAKE 1 TABLET AS NEEDED BY MOUTH EVERY 6 HOURS FOR 25 DAYS   [DISCONTINUED] pseudoephedrine (SUDAFED) 30 MG tablet TAKE 1 TABLET BY MOUTH EVERY 6 HOURS AS NEEDED.   [DISCONTINUED] pseudoephedrine (SUDAFED) 30 MG tablet Take 1 tablet by mouth every 6 hours as needed   No facility-administered encounter medications on file as of 05/02/2021.   Physical Exam: Televisit  Data Reviewed: Imaging: Chest x-ray 11/12/2013-no acute cardiopulmonary abnormality CT sinus 06/02/2016-chronic and acute sinus disease. Chest x-ray 11/01/2020-acute cardiopulmonary disease. I have reviewed the images personally.  PFTs: Spirometry 06/18/2016 FVC 4.35 [83%], FEV1 3.52 [100%], F/F 18 Normal test  PFTs 06/22/2020 FVC 4.59 [95%], FEV1 3.28 [89%], F/F 71, TLC 6.73 [96%], DLCO 28.60 [102%] Normal tests  Labs: CBC 01/28/2020-WBC 7.5, eos 4.8%, absolute eosinophil count 360 Respiratory profile  01/28/2020-IgE 15, RAST panel-negative  Assessment:  Chronic rhinosinusitis, asthma Improved with Dupixent therapy and asthma  Off Dupixent due to tremors which is not a typical side effect. We discussed alternate therapies We will try Dupixent with low-dose of 200 every other day.  If tremors recur or sinusitis/asthma symptoms are not controlled with this dose then we may need to consider alternate biologics such as nucala   Post COVID-19 Developed COVID-19 in January 2021 PFTs and CXR are normal   Plan/Recommendations: Attempt Dupixent 200 mg every other week Continue Symbicort   I discussed the assessment and treatment plan with the patient. The patient was provided an opportunity to ask questions and all were answered. The patient agreed with the plan and demonstrated an understanding of the instructions.   The patient was advised to call back or seek an in-person evaluation if the symptoms worsen or if the condition fails to improve as anticipated.  I provided 15 minutes of non-face-to-face time during this encounter.  Marshell Garfinkel MD [facility] 05/02/2021, 10:43 AM  CC: Shirline Frees, MD

## 2021-05-03 ENCOUNTER — Other Ambulatory Visit (HOSPITAL_COMMUNITY): Payer: Self-pay

## 2021-05-03 NOTE — Telephone Encounter (Signed)
Received notification from [facility] regarding a prior authorization for [place]. Authorization has been APPROVED from 05/03/2021 to 05/02/2022 for a maximum of twelve (12) fills.   Patient must fill through [place]: [phone]   Authorization # [id number] Phone # [phone]   Test claim revealed that PA has not adjudicated at pharmacy level at this time. Will mark for f/u.

## 2021-05-03 NOTE — Telephone Encounter (Signed)
Submitted a Prior Authorization request to [facility] for DUPIXENT via CoverMyMeds. Will update once we receive a response.   Key: [id number]

## 2021-05-04 ENCOUNTER — Other Ambulatory Visit: Payer: Self-pay | Admitting: Pharmacist

## 2021-05-04 ENCOUNTER — Other Ambulatory Visit (HOSPITAL_COMMUNITY): Payer: Self-pay

## 2021-05-04 DIAGNOSIS — J4540 Moderate persistent asthma, uncomplicated: Secondary | ICD-10-CM

## 2021-05-04 MED ORDER — DUPIXENT 200 MG/1.14ML ~~LOC~~ SOSY
200.0000 mg | PREFILLED_SYRINGE | SUBCUTANEOUS | 5 refills | Status: DC
  Filled 2021-05-04: qty 2.28, 28d supply, fill #0

## 2021-05-04 MED ORDER — DUPIXENT 200 MG/1.14ML ~~LOC~~ SOSY
200.0000 mg | PREFILLED_SYRINGE | SUBCUTANEOUS | 5 refills | Status: DC
  Filled 2021-05-04 – 2021-05-11 (×4): qty 2.28, 28d supply, fill #0
  Filled 2021-06-11: qty 2.28, 28d supply, fill #1
  Filled 2021-07-10: qty 2.28, 28d supply, fill #2
  Filled 2021-08-10: qty 2.28, 28d supply, fill #3
  Filled 2021-09-19: qty 2.28, 28d supply, fill #4
  Filled 2021-10-19: qty 2.28, 28d supply, fill #5

## 2021-05-04 NOTE — Telephone Encounter (Signed)
Rx for Dupixent 200mg  PFS sent to [facility] to process. Prior Josem Kaufmann was approved but running into errors with adjudication. Rx sent to help expedite processing issues.  Knox Saliva, PharmD, MPH Clinical Pharmacist (Rheumatology and Pulmonology)

## 2021-05-04 NOTE — Telephone Encounter (Signed)
Resubmitted PA request for DUPIXENT 200mg /1.14mL PRE-FILLED SYRINGES.  Key: [id number]

## 2021-05-07 ENCOUNTER — Other Ambulatory Visit (HOSPITAL_COMMUNITY): Payer: Self-pay

## 2021-05-08 ENCOUNTER — Other Ambulatory Visit (HOSPITAL_COMMUNITY): Payer: Self-pay

## 2021-05-08 NOTE — Telephone Encounter (Signed)
Received notification from [facility] regarding a prior authorization for DUPIXENT 200mg /1.28mL PRE-FILLED SYRINGES. Authorization has been APPROVED from 05/07/2021 to 05/06/2022.   Patient must fill through [place]: [phone]   Authorization # [id number] Phone # [phone]  Test claim revealed that insurance covers $3,147.80 and copay card covers an additional $1,000.00, leaving patient with a copay of $0.00.

## 2021-05-10 ENCOUNTER — Other Ambulatory Visit (HOSPITAL_COMMUNITY): Payer: Self-pay

## 2021-05-10 NOTE — Telephone Encounter (Signed)
Called patient to advise that rx for Dupixent 200mg  PFS was sent to [facility]. He does not require a new start visit as he was previously taking Dupixent 300mg  PFS. Decision was made to change to lower dose d/t tremor (?) potentially induced by med per patient.  Left VM with [facility] phone number to schedule shipment.  Knox Saliva, PharmD, MPH Clinical Pharmacist (Rheumatology and Pulmonology)

## 2021-05-11 ENCOUNTER — Other Ambulatory Visit (HOSPITAL_COMMUNITY): Payer: Self-pay

## 2021-05-14 ENCOUNTER — Other Ambulatory Visit (HOSPITAL_COMMUNITY): Payer: Self-pay

## 2021-05-14 DIAGNOSIS — U071 COVID-19: Secondary | ICD-10-CM | POA: Diagnosis not present

## 2021-05-15 ENCOUNTER — Other Ambulatory Visit (HOSPITAL_COMMUNITY): Payer: Self-pay

## 2021-05-15 MED ORDER — PSEUDOEPHEDRINE HCL 30 MG PO TABS
30.0000 mg | ORAL_TABLET | Freq: Four times a day (QID) | ORAL | 0 refills | Status: DC | PRN
  Filled 2021-05-15: qty 100, 25d supply, fill #0

## 2021-05-18 ENCOUNTER — Other Ambulatory Visit (HOSPITAL_COMMUNITY): Payer: Self-pay

## 2021-05-30 DIAGNOSIS — R5383 Other fatigue: Secondary | ICD-10-CM | POA: Diagnosis not present

## 2021-05-30 DIAGNOSIS — Z77120 Contact with and (suspected) exposure to mold (toxic): Secondary | ICD-10-CM | POA: Diagnosis not present

## 2021-05-30 DIAGNOSIS — J309 Allergic rhinitis, unspecified: Secondary | ICD-10-CM | POA: Diagnosis not present

## 2021-05-30 DIAGNOSIS — R0981 Nasal congestion: Secondary | ICD-10-CM | POA: Diagnosis not present

## 2021-05-30 DIAGNOSIS — R059 Cough, unspecified: Secondary | ICD-10-CM | POA: Diagnosis not present

## 2021-05-31 DIAGNOSIS — J4520 Mild intermittent asthma, uncomplicated: Secondary | ICD-10-CM | POA: Diagnosis not present

## 2021-05-31 DIAGNOSIS — Z1211 Encounter for screening for malignant neoplasm of colon: Secondary | ICD-10-CM | POA: Diagnosis not present

## 2021-05-31 DIAGNOSIS — K219 Gastro-esophageal reflux disease without esophagitis: Secondary | ICD-10-CM | POA: Diagnosis not present

## 2021-05-31 DIAGNOSIS — Z0000 Encounter for general adult medical examination without abnormal findings: Secondary | ICD-10-CM | POA: Diagnosis not present

## 2021-05-31 DIAGNOSIS — Z125 Encounter for screening for malignant neoplasm of prostate: Secondary | ICD-10-CM | POA: Diagnosis not present

## 2021-05-31 DIAGNOSIS — E782 Mixed hyperlipidemia: Secondary | ICD-10-CM | POA: Diagnosis not present

## 2021-05-31 DIAGNOSIS — R002 Palpitations: Secondary | ICD-10-CM | POA: Diagnosis not present

## 2021-06-07 ENCOUNTER — Other Ambulatory Visit (HOSPITAL_COMMUNITY): Payer: Self-pay

## 2021-06-11 ENCOUNTER — Other Ambulatory Visit (HOSPITAL_COMMUNITY): Payer: Self-pay

## 2021-06-13 DIAGNOSIS — U071 COVID-19: Secondary | ICD-10-CM | POA: Diagnosis not present

## 2021-06-18 ENCOUNTER — Other Ambulatory Visit (HOSPITAL_COMMUNITY): Payer: Self-pay

## 2021-06-18 MED ORDER — ETODOLAC 400 MG PO TABS
400.0000 mg | ORAL_TABLET | Freq: Two times a day (BID) | ORAL | 3 refills | Status: AC
  Filled 2021-06-18: qty 180, 90d supply, fill #0
  Filled 2021-09-19: qty 180, 90d supply, fill #1
  Filled 2022-01-07: qty 180, 90d supply, fill #2
  Filled 2022-04-02: qty 180, 90d supply, fill #3

## 2021-06-18 MED ORDER — METOPROLOL TARTRATE 50 MG PO TABS
25.0000 mg | ORAL_TABLET | Freq: Two times a day (BID) | ORAL | 3 refills | Status: AC
  Filled 2021-06-18: qty 90, 90d supply, fill #0
  Filled 2021-11-21: qty 90, 90d supply, fill #1
  Filled 2022-04-02: qty 90, 90d supply, fill #2

## 2021-06-19 ENCOUNTER — Other Ambulatory Visit (HOSPITAL_COMMUNITY): Payer: Self-pay

## 2021-07-04 ENCOUNTER — Other Ambulatory Visit (HOSPITAL_COMMUNITY): Payer: Self-pay

## 2021-07-04 MED ORDER — PSEUDOEPHEDRINE HCL 30 MG PO TABS
30.0000 mg | ORAL_TABLET | Freq: Four times a day (QID) | ORAL | 0 refills | Status: DC | PRN
  Filled 2021-07-04: qty 100, 25d supply, fill #0

## 2021-07-04 MED ORDER — ALBUTEROL SULFATE HFA 108 (90 BASE) MCG/ACT IN AERS
2.0000 | INHALATION_SPRAY | Freq: Four times a day (QID) | RESPIRATORY_TRACT | 3 refills | Status: DC
  Filled 2021-07-04: qty 18, 25d supply, fill #0

## 2021-07-04 MED ORDER — AZELASTINE HCL 0.1 % NA SOLN
1.0000 | Freq: Two times a day (BID) | NASAL | 3 refills | Status: AC
  Filled 2021-07-04: qty 30, 50d supply, fill #0
  Filled 2022-04-08: qty 30, 50d supply, fill #1

## 2021-07-10 ENCOUNTER — Other Ambulatory Visit (HOSPITAL_COMMUNITY): Payer: Self-pay

## 2021-07-14 DIAGNOSIS — U071 COVID-19: Secondary | ICD-10-CM | POA: Diagnosis not present

## 2021-07-17 ENCOUNTER — Other Ambulatory Visit (HOSPITAL_COMMUNITY): Payer: Self-pay

## 2021-07-18 ENCOUNTER — Other Ambulatory Visit (HOSPITAL_COMMUNITY): Payer: Self-pay

## 2021-08-09 ENCOUNTER — Other Ambulatory Visit (HOSPITAL_COMMUNITY): Payer: Self-pay

## 2021-08-09 MED ORDER — PSEUDOEPHEDRINE HCL 30 MG PO TABS
30.0000 mg | ORAL_TABLET | Freq: Four times a day (QID) | ORAL | 1 refills | Status: DC
  Filled 2021-08-09: qty 100, 25d supply, fill #0
  Filled 2021-09-20: qty 100, 25d supply, fill #1

## 2021-08-09 MED ORDER — ESOMEPRAZOLE MAGNESIUM 40 MG PO CPDR
40.0000 mg | DELAYED_RELEASE_CAPSULE | Freq: Every day | ORAL | 1 refills | Status: DC
  Filled 2021-08-09: qty 90, 90d supply, fill #0

## 2021-08-10 ENCOUNTER — Other Ambulatory Visit (HOSPITAL_COMMUNITY): Payer: Self-pay

## 2021-08-10 MED FILL — Budesonide-Formoterol Fumarate Dihyd Aerosol 80-4.5 MCG/ACT: RESPIRATORY_TRACT | 30 days supply | Qty: 10.2 | Fill #0 | Status: AC

## 2021-08-14 DIAGNOSIS — U071 COVID-19: Secondary | ICD-10-CM | POA: Diagnosis not present

## 2021-08-22 ENCOUNTER — Other Ambulatory Visit (HOSPITAL_COMMUNITY): Payer: Self-pay

## 2021-08-23 ENCOUNTER — Other Ambulatory Visit (HOSPITAL_COMMUNITY): Payer: Self-pay

## 2021-08-28 DIAGNOSIS — Z1211 Encounter for screening for malignant neoplasm of colon: Secondary | ICD-10-CM | POA: Diagnosis not present

## 2021-09-13 ENCOUNTER — Telehealth: Payer: Self-pay | Admitting: Pharmacist

## 2021-09-13 NOTE — Telephone Encounter (Signed)
Called patient to schedule an appointment for the [place] [facility]. I was unable to reach the patient so I left a HIPAA-compliant message requesting that the patient return my call.   Luke Van Ausdall, PharmD, BCACP, CPP Clinical Pharmacist [facility] [phone]  

## 2021-09-17 ENCOUNTER — Other Ambulatory Visit (HOSPITAL_COMMUNITY): Payer: Self-pay

## 2021-09-18 ENCOUNTER — Other Ambulatory Visit (HOSPITAL_COMMUNITY): Payer: Self-pay

## 2021-09-18 MED ORDER — BUDESONIDE-FORMOTEROL FUMARATE 160-4.5 MCG/ACT IN AERO
2.0000 | INHALATION_SPRAY | Freq: Two times a day (BID) | RESPIRATORY_TRACT | 5 refills | Status: DC
  Filled 2021-09-18 – 2021-09-19 (×2): qty 10.2, 30d supply, fill #0

## 2021-09-19 ENCOUNTER — Other Ambulatory Visit (HOSPITAL_COMMUNITY): Payer: Self-pay

## 2021-09-19 MED FILL — Budesonide-Formoterol Fumarate Dihyd Aerosol 80-4.5 MCG/ACT: RESPIRATORY_TRACT | 30 days supply | Qty: 10.2 | Fill #1 | Status: CN

## 2021-09-20 ENCOUNTER — Other Ambulatory Visit (HOSPITAL_COMMUNITY): Payer: Self-pay

## 2021-09-20 MED FILL — Budesonide-Formoterol Fumarate Dihyd Aerosol 80-4.5 MCG/ACT: RESPIRATORY_TRACT | 30 days supply | Qty: 10.2 | Fill #1 | Status: AC

## 2021-09-20 MED FILL — Budesonide-Formoterol Fumarate Dihyd Aerosol 80-4.5 MCG/ACT: RESPIRATORY_TRACT | 30 days supply | Qty: 10.2 | Fill #1 | Status: CN

## 2021-09-24 ENCOUNTER — Other Ambulatory Visit (HOSPITAL_COMMUNITY): Payer: Self-pay

## 2021-09-25 ENCOUNTER — Other Ambulatory Visit (HOSPITAL_COMMUNITY): Payer: Self-pay

## 2021-10-15 ENCOUNTER — Other Ambulatory Visit (HOSPITAL_COMMUNITY): Payer: Self-pay

## 2021-10-19 ENCOUNTER — Other Ambulatory Visit (HOSPITAL_COMMUNITY): Payer: Self-pay

## 2021-10-22 ENCOUNTER — Other Ambulatory Visit (HOSPITAL_COMMUNITY): Payer: Self-pay

## 2021-11-08 ENCOUNTER — Other Ambulatory Visit (HOSPITAL_COMMUNITY): Payer: Self-pay

## 2021-11-13 ENCOUNTER — Telehealth: Payer: Self-pay | Admitting: Pharmacist

## 2021-11-13 ENCOUNTER — Telehealth: Payer: Self-pay | Admitting: Pulmonary Disease

## 2021-11-13 ENCOUNTER — Other Ambulatory Visit: Payer: Self-pay

## 2021-11-13 ENCOUNTER — Ambulatory Visit: Payer: 59 | Attending: Family Medicine | Admitting: Pharmacist

## 2021-11-13 ENCOUNTER — Other Ambulatory Visit (HOSPITAL_COMMUNITY): Payer: Self-pay

## 2021-11-13 ENCOUNTER — Other Ambulatory Visit: Payer: Self-pay | Admitting: Pharmacist

## 2021-11-13 DIAGNOSIS — J4540 Moderate persistent asthma, uncomplicated: Secondary | ICD-10-CM

## 2021-11-13 DIAGNOSIS — Z79899 Other long term (current) drug therapy: Secondary | ICD-10-CM

## 2021-11-13 MED ORDER — DUPIXENT 200 MG/1.14ML ~~LOC~~ SOSY
200.0000 mg | PREFILLED_SYRINGE | SUBCUTANEOUS | 1 refills | Status: DC
  Filled 2021-11-13: qty 2.28, 28d supply, fill #0

## 2021-11-13 MED ORDER — DUPIXENT 200 MG/1.14ML ~~LOC~~ SOSY
200.0000 mg | PREFILLED_SYRINGE | SUBCUTANEOUS | 1 refills | Status: DC
  Filled 2021-11-13: qty 2.28, 28d supply, fill #0
  Filled 2021-12-12: qty 2.28, 28d supply, fill #1

## 2021-11-13 NOTE — Telephone Encounter (Signed)
Left voicemail for patient to call back to schedule follow up. 

## 2021-11-13 NOTE — Telephone Encounter (Signed)
Called patient to schedule an appointment for the [place] [employer] [facility] [employer] [facility]. I was unable to reach the patient so I left a HIPAA-compliant message requesting that the patient return my call.   Luke Van Ausdall, PharmD, BCACP, CPP Clinical Pharmacist [facility] [phone]  

## 2021-11-13 NOTE — Progress Notes (Signed)
° °  S: Patient presents for review of their specialty medication therapy.  Patient is currently taking Dupixent for asthma/chronic rhinosinusitis. Patient is managed by Dr. Vaughan Browner for this.   Adherence: reported  Efficacy: reports good efficacy but had to decrease to the 200 mg q2wk dose d/t side effects since the last time I spoke with him.   Dosing: 200 mg subq q14days  Dose adjustments: Renal: no dose adjustments (has not been studied) Hepatic: no dose adjustments (has not been studied)  Drug-drug interactions: none identified  Monitoring: S/sx of infection: none  S/sx of hypersensitivity:none S/sx of ocular effects: per pt's wife, pt experienced some changes in vision with the 300 mg dose that improved when he transitioned to the 200 mg dose  S/sx of eosinophilia/vasculitis: counseling given Other side effects: bowel habit changes with the 300 mg dose. Better with the 200 mg dose.   O: Lab Results  Component Value Date   WBC 7.5 01/28/2020   HGB 15.1 01/28/2020   HCT 44.3 01/28/2020   MCV 96.9 01/28/2020   PLT 204.0 01/28/2020      Chemistry   No results found for: NA, K, CL, CO2, BUN, CREATININE, GLU No results found for: CALCIUM, ALKPHOS, AST, ALT, BILITOT     A/P: 1. Medication review: Patient currently on [place] for asthma/chronic rhinosinusitis and is tolerating it better with the reduction to the 200 mg dose. Reviewed the medication with the patient, including the following: Dupixent is a monoclonal antibody used for the treatment of asthma or atopic dermatitis. Patient educated on purpose, proper use and potential adverse effects of Dupixent. Possible adverse effects include increased risk of infection, ocular effects, vasculitis/eosinophilia, and hypersensitivity reactions. Administer as a SubQ injection and rotate sites. Allow the medication to reach room temp prior to administration (45 mins for 300 mg syringe or 30 min for 200 mg syringe). Do not shake.  Discard any unused portion. No recommendations for any changes.   Benard Halsted, PharmD, [facility] [place], [place] [phone]

## 2021-11-13 NOTE — Telephone Encounter (Signed)
Refill sent for [place] to [place]: [phone]   Dose: 200 mg SQ every 14 days  Last OV: 05/02/21 - decision was made to reduce Dupixent to 200 mg d/t tremor with anticipated f/u in 3-6 months  Provider: Dr Vaughan Browner  Next OV: not yet scheduled, will route to front desk  Knox Saliva, PharmD, MPH, BCPS Clinical Pharmacist (Rheumatology and Pulmonology)

## 2021-11-15 ENCOUNTER — Other Ambulatory Visit (HOSPITAL_COMMUNITY): Payer: Self-pay

## 2021-11-15 MED ORDER — PSEUDOEPHEDRINE HCL 30 MG PO TABS
30.0000 mg | ORAL_TABLET | Freq: Four times a day (QID) | ORAL | 1 refills | Status: DC | PRN
  Filled 2021-11-15: qty 100, 25d supply, fill #0
  Filled 2022-02-20: qty 100, 25d supply, fill #1

## 2021-11-20 ENCOUNTER — Other Ambulatory Visit (HOSPITAL_COMMUNITY): Payer: Self-pay

## 2021-11-21 ENCOUNTER — Other Ambulatory Visit: Payer: Self-pay | Admitting: Pulmonary Disease

## 2021-11-21 ENCOUNTER — Other Ambulatory Visit (HOSPITAL_COMMUNITY): Payer: Self-pay

## 2021-11-21 MED ORDER — BUDESONIDE-FORMOTEROL FUMARATE 80-4.5 MCG/ACT IN AERO
2.0000 | INHALATION_SPRAY | Freq: Two times a day (BID) | RESPIRATORY_TRACT | 5 refills | Status: DC
  Filled 2021-11-21: qty 10.2, 30d supply, fill #0
  Filled 2022-01-07: qty 10.2, 30d supply, fill #1
  Filled 2022-04-02: qty 10.2, 30d supply, fill #2

## 2021-12-06 ENCOUNTER — Other Ambulatory Visit (HOSPITAL_COMMUNITY): Payer: Self-pay

## 2021-12-12 ENCOUNTER — Other Ambulatory Visit (HOSPITAL_COMMUNITY): Payer: Self-pay

## 2021-12-13 DIAGNOSIS — F419 Anxiety disorder, unspecified: Secondary | ICD-10-CM | POA: Diagnosis not present

## 2021-12-13 DIAGNOSIS — R7989 Other specified abnormal findings of blood chemistry: Secondary | ICD-10-CM | POA: Diagnosis not present

## 2021-12-13 DIAGNOSIS — E559 Vitamin D deficiency, unspecified: Secondary | ICD-10-CM | POA: Diagnosis not present

## 2021-12-26 ENCOUNTER — Other Ambulatory Visit (HOSPITAL_COMMUNITY): Payer: Self-pay

## 2021-12-26 DIAGNOSIS — M25511 Pain in right shoulder: Secondary | ICD-10-CM | POA: Diagnosis not present

## 2021-12-26 MED ORDER — "BD LUER-LOK SYRINGE 18G X 1-1/2"" 3 ML MISC"
3 refills | Status: AC
  Filled 2021-12-26: qty 10, 35d supply, fill #0

## 2021-12-26 MED ORDER — TESTOSTERONE CYPIONATE 200 MG/ML IM SOLN
INTRAMUSCULAR | 3 refills | Status: AC
  Filled 2021-12-26: qty 10, 35d supply, fill #0

## 2021-12-26 MED ORDER — "BD DISP NEEDLE 23G X 1"" MISC"
3 refills | Status: AC
  Filled 2021-12-26: qty 10, 35d supply, fill #0

## 2021-12-27 ENCOUNTER — Other Ambulatory Visit (HOSPITAL_COMMUNITY): Payer: Self-pay

## 2021-12-31 ENCOUNTER — Other Ambulatory Visit (HOSPITAL_COMMUNITY): Payer: Self-pay

## 2022-01-03 ENCOUNTER — Other Ambulatory Visit (HOSPITAL_COMMUNITY): Payer: Self-pay

## 2022-01-03 ENCOUNTER — Telehealth: Payer: Self-pay | Admitting: Pulmonary Disease

## 2022-01-03 ENCOUNTER — Other Ambulatory Visit: Payer: Self-pay | Admitting: Pharmacist

## 2022-01-03 DIAGNOSIS — J4540 Moderate persistent asthma, uncomplicated: Secondary | ICD-10-CM

## 2022-01-03 MED ORDER — DUPIXENT 200 MG/1.14ML ~~LOC~~ SOSY
200.0000 mg | PREFILLED_SYRINGE | SUBCUTANEOUS | 0 refills | Status: DC
  Filled 2022-01-03: qty 2.28, 28d supply, fill #0

## 2022-01-03 NOTE — Telephone Encounter (Signed)
Refill sent for [place] to [facility]: [phone]   Dose: 200 mg SQ every 2 weeks  Last OV: 05/02/21 Provider: Dr. Vaughan Browner  Next OV: not scheduled, left VM with patient to notify that no further refills can be sent until he is seen in clinic  Knox Saliva, PharmD, MPH, BCPS Clinical Pharmacist (Rheumatology and Pulmonology)

## 2022-01-04 ENCOUNTER — Other Ambulatory Visit (HOSPITAL_COMMUNITY): Payer: Self-pay

## 2022-01-07 ENCOUNTER — Other Ambulatory Visit (HOSPITAL_COMMUNITY): Payer: Self-pay

## 2022-01-10 ENCOUNTER — Other Ambulatory Visit (HOSPITAL_COMMUNITY): Payer: Self-pay

## 2022-01-11 NOTE — Telephone Encounter (Signed)
Left voicemail on home phone to call back to schedule an appointment, called mobile number on file which was his spouse, Suanne Marker (on [place]) and gave his cell phone number, which I also left a voicemail.

## 2022-01-15 ENCOUNTER — Other Ambulatory Visit (HOSPITAL_COMMUNITY): Payer: Self-pay

## 2022-01-15 MED ORDER — ALPRAZOLAM 0.25 MG PO TABS
0.2500 mg | ORAL_TABLET | Freq: Two times a day (BID) | ORAL | 0 refills | Status: DC | PRN
  Filled 2022-01-15: qty 6, 3d supply, fill #0

## 2022-01-16 DIAGNOSIS — Z1589 Genetic susceptibility to other disease: Secondary | ICD-10-CM | POA: Diagnosis not present

## 2022-01-16 DIAGNOSIS — F419 Anxiety disorder, unspecified: Secondary | ICD-10-CM | POA: Diagnosis not present

## 2022-01-16 DIAGNOSIS — R5383 Other fatigue: Secondary | ICD-10-CM | POA: Diagnosis not present

## 2022-01-16 DIAGNOSIS — R251 Tremor, unspecified: Secondary | ICD-10-CM | POA: Diagnosis not present

## 2022-01-22 NOTE — Telephone Encounter (Signed)
After multiple attempts to call and mychart message, I have mailed patient a letter to call the office back to get an appointment scheduled. ?

## 2022-02-04 ENCOUNTER — Other Ambulatory Visit (HOSPITAL_COMMUNITY): Payer: Self-pay

## 2022-02-06 ENCOUNTER — Other Ambulatory Visit (HOSPITAL_COMMUNITY): Payer: Self-pay

## 2022-02-08 ENCOUNTER — Other Ambulatory Visit (HOSPITAL_COMMUNITY): Payer: Self-pay

## 2022-02-11 ENCOUNTER — Other Ambulatory Visit (HOSPITAL_COMMUNITY): Payer: Self-pay

## 2022-02-11 ENCOUNTER — Telehealth: Payer: Self-pay | Admitting: Pulmonary Disease

## 2022-02-11 NOTE — Telephone Encounter (Signed)
Lm for patient.  

## 2022-02-13 ENCOUNTER — Other Ambulatory Visit (HOSPITAL_COMMUNITY): Payer: Self-pay

## 2022-02-13 DIAGNOSIS — I781 Nevus, non-neoplastic: Secondary | ICD-10-CM | POA: Diagnosis not present

## 2022-02-20 ENCOUNTER — Other Ambulatory Visit (HOSPITAL_COMMUNITY): Payer: Self-pay

## 2022-02-27 NOTE — Telephone Encounter (Signed)
Reviewed patient's chart. He was able to get scheduled to see Dr. Vaughan Browner on 03/13/22 at 230pm. Will close encounter.  ?

## 2022-03-04 DIAGNOSIS — L723 Sebaceous cyst: Secondary | ICD-10-CM | POA: Diagnosis not present

## 2022-03-12 ENCOUNTER — Other Ambulatory Visit (HOSPITAL_COMMUNITY): Payer: Self-pay

## 2022-03-13 ENCOUNTER — Encounter: Payer: Self-pay | Admitting: Pulmonary Disease

## 2022-03-13 ENCOUNTER — Ambulatory Visit (INDEPENDENT_AMBULATORY_CARE_PROVIDER_SITE_OTHER): Payer: 59 | Admitting: Pulmonary Disease

## 2022-03-13 VITALS — BP 122/68 | HR 72 | Temp 98.1°F

## 2022-03-13 DIAGNOSIS — J4540 Moderate persistent asthma, uncomplicated: Secondary | ICD-10-CM

## 2022-03-13 NOTE — Progress Notes (Signed)
? ?      ?Jason Schneider    [id number]    [birth year] ? ?Primary Care Physician:Harris, Gwyndolyn Saxon, MD ? ?Referring Physician: Shirline Frees, MD ?[address] ?[address] ?[place],  [place] [address] ? ? ?Chief complaint: Consult for asthma, chronic rhinosinusitis ? ?HPI: ?61 year old with history of asthma, chronic rhinosinusitis, allergies ? ?Was followed by allergist in his teens and was receiving allergy shots but not recently ?He has history of significant chronic nasal congestion, postnasal discharge and chronic sinus complaints of headache and drainage. He underwent previous septoplasty and balloon sinus plasty with limited improvement in symptoms. CT scan shows diffuse mucosal disease involving the maxillary and ethmoid sinuses bilaterally with extension to the nasal frontal recess. ? ?Has significant issues with chronic sinus congestion, recurrent sinus exacerbations. He is on nasal spray, pseudoephedrine. The only thing that helps is prednisone. He is also on albuterol for his asthma. Not on maintenance inhalers. ? ?Started on [place] in April 2021, has occasional blurring of vision.  Followed up with ophthalmology ?Reports seeing ENT who did a scope and noted nasal polyposis. ?COVID-19 in January 2021.  Did not require hospitalization and self treated at home. ? 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 No mold, hot tub, Jacuzzi. No down pillows or comforter ?Smoking history: Never smoker ?Travel history: No significant travel history ?Relevant family history: No significant family of lung disease ? ?Interim history: ?Continues to have problems of tremors with the dupixent.  Dose reduced to 200 mg every 2 weeks which improved symptoms but after some time it started recurring so now he is taking Dupixent at a dose of 200 mg every 3 weeks ? ?Even at this dose his symptoms are well controlled.  States that sinusitis symptoms have improved ? ?Outpatient  Encounter Medications as of 03/13/2022  ?Medication Sig  ? albuterol (VENTOLIN HFA) 108 (90 Base) MCG/ACT inhaler INHALE 2 PUFFS BY MOUTH EVERY 6 HOURS AS NEEDED  ? ALPRAZolam (XANAX) 0.25 MG tablet Take 1 tablet (0.25 mg total) by mouth 2 (two) times daily as needed for anxiety. (Patient not taking: Reported on 03/13/2022)  ? azelastine (ASTELIN) 0.1 % nasal spray Place 1 spray into both nostrils 2 (two) times daily.  ? budesonide-formoterol (SYMBICORT) 80-4.5 MCG/ACT inhaler INHALE 2 PUFFS INTO THE LUNGS IN THE MORNING AND AT BEDTIME.  ? dupilumab (DUPIXENT) 200 MG/1.14ML prefilled syringe Inject 200 mg into the skin every 14 (fourteen) days.  ? esomeprazole (NEXIUM) 40 MG capsule Take 1 capsule (40 mg total) by mouth daily.  ? etodolac (LODINE) 400 MG tablet Take 1 tablet (400 mg total) by mouth 2 (two) times daily.  ? fluticasone (FLONASE) 50 MCG/ACT nasal spray Place 2 sprays into both nostrils daily.  ? levalbuterol (XOPENEX) 0.63 MG/3ML nebulizer solution INHALE 1 VIAL BY NEBULIZER EVERY 8 HOURS AS NEEDED FOR WHEEZING.  ? loratadine (CLARITIN) 10 MG tablet Take 10 mg by mouth daily.  ? metoprolol tartrate (LOPRESSOR) 50 MG tablet Take 0.5 tablets (25 mg total) by mouth 2 (two) times daily.  ? Multiple Vitamin (MULTIVITAMIN) capsule Take 1 capsule by mouth daily.  ? NEEDLE, DISP, 23 G (BD DISP NEEDLE) 23G X 1" MISC Use to inject testosterone  ? pseudoephedrine (SUDAFED) 30 MG tablet Take 1 tablet (30 mg total) by mouth every 6 (six) hours as needed.  ? SYRINGE-NEEDLE, DISP, 3 ML (B-D 3CC LUER-LOK SYR 18GX1-1/2) 18G X 1-1/2" 3 ML MISC Use to draw testosterone twice a week  ?  testosterone cypionate (DEPOTESTOSTERONE CYPIONATE) 200 MG/ML injection INJECT 0.5 ML TWICE WEEKLY INTO THIGH (SINGLE USE VIALS)  ? [DISCONTINUED] albuterol (VENTOLIN HFA) 108 (90 Base) MCG/ACT inhaler Inhale 2 puffs into the lungs every 6 (six) hours as needed  ? [DISCONTINUED] azelastine (ASTELIN) 0.1 % nasal spray Place 2 sprays into both  nostrils 2 (two) times daily.   ? [DISCONTINUED] clotrimazole-betamethasone (LOTRISONE) cream Apply 1 application topically to affected area 2 (two) times daily for 14 days  ? [DISCONTINUED] metoprolol (LOPRESSOR) 50 MG tablet Take 50 mg by mouth daily.  ? [DISCONTINUED] pseudoephedrine (SUDAFED) 30 MG tablet Take 30 mg by mouth every 6 (six) hours as needed.   ? [DISCONTINUED] pseudoephedrine (SUDOGEST) 30 MG tablet Take 1 tablet (30 mg total) by mouth every 6 (six) hours as needed.  ? [DISCONTINUED] testosterone cypionate (DEPOTESTOSTERONE CYPIONATE) 200 MG/ML injection INJECT 0.25ML TWICE WEEKLY INTO THIGH  ? ?No facility-administered encounter medications on file as of 03/13/2022.  ? ?Physical Exam: ?Blood pressure 122/68, pulse 72, temperature 98.1 ?F (36.7 ?C), temperature source Oral, SpO2 98 %. ?Gen:      No acute distress ?HEENT:  EOMI, sclera anicteric ?Neck:     No masses; no thyromegaly ?Lungs:    Clear to auscultation bilaterally; normal respiratory effort ?CV:         Regular rate and rhythm; no murmurs ?Abd:      + bowel sounds; soft, non-tender; no palpable masses, no distension ?Ext:    No edema; adequate peripheral perfusion ?Skin:      Warm and dry; no rash ?Neuro: alert and oriented x 3 ?Psych: normal mood and affect  ? ?Data Reviewed: ?Imaging: ?Chest x-ray 11/12/2013-no acute cardiopulmonary abnormality ?CT sinus 06/02/2016-chronic and acute sinus disease. ?Chest x-ray 11/01/2020-acute cardiopulmonary disease. ?I have reviewed the images personally. ? ?PFTs: ?Spirometry 06/18/2016 ?FVC 4.35 [83%], FEV1 3.52 [100%], F/F 18 ?Normal test ? ?PFTs 06/22/2020 ?FVC 4.59 [95%], FEV1 3.28 [89%], F/F 71, TLC 6.73 [96%], DLCO 28.60 [102%] ?Normal tests ? ?Labs: ?CBC 01/28/2020-WBC 7.5, eos 4.8%, absolute eosinophil count 360 ?Respiratory profile 01/28/2020-IgE 15, RAST panel-negative ? ?Assessment:  ?Chronic rhinosinusitis, asthma ?Improved with Dupixent therapy ? ?Continue Dupixent with low-dose of 200 every 3  weeks.  If tremors recur or sinusitis/asthma symptoms are not controlled with this dose then we may need to consider alternate biologics such as nucala ? ? ?Post COVID-19 ?Developed COVID-19 in January 2021 ?PFTs and CXR are normal ? ? ?Plan/Recommendations: ?Attempt Dupixent 200 mg every 3 weeks ?Continue Symbicort ? ? ?Marshell Garfinkel MD ?[facility] ?03/13/2022, 2:46 PM ? ?CC: Shirline Frees, MD ? ? ?

## 2022-03-13 NOTE — Patient Instructions (Signed)
Continue Dupixent at current dosing of 200 mg every 3 weeks ?Follow-up in 6 months ?

## 2022-03-14 ENCOUNTER — Other Ambulatory Visit (HOSPITAL_COMMUNITY): Payer: Self-pay

## 2022-03-18 ENCOUNTER — Other Ambulatory Visit: Payer: Self-pay | Admitting: Pulmonary Disease

## 2022-03-18 ENCOUNTER — Other Ambulatory Visit: Payer: Self-pay | Admitting: Pharmacist

## 2022-03-18 ENCOUNTER — Other Ambulatory Visit (HOSPITAL_COMMUNITY): Payer: Self-pay

## 2022-03-18 DIAGNOSIS — J4540 Moderate persistent asthma, uncomplicated: Secondary | ICD-10-CM

## 2022-03-18 MED ORDER — DUPIXENT 200 MG/1.14ML ~~LOC~~ SOSY
200.0000 mg | PREFILLED_SYRINGE | SUBCUTANEOUS | 0 refills | Status: DC
  Filled 2022-03-18: qty 2.28, 42d supply, fill #0
  Filled 2022-04-12: qty 1.14, 21d supply, fill #0
  Filled 2022-05-09: qty 1.14, 21d supply, fill #1

## 2022-03-18 MED ORDER — DUPIXENT 200 MG/1.14ML ~~LOC~~ SOSY
200.0000 mg | PREFILLED_SYRINGE | SUBCUTANEOUS | 0 refills | Status: DC
  Filled 2022-03-18: qty 2.28, 42d supply, fill #0

## 2022-03-18 NOTE — Telephone Encounter (Signed)
Refill sent for [place] to [facility]: [phone]  ? ?Dose: 200 mg every 3 weeks ? ?Last OV: 03/13/22 ?Provider: Dr. Vaughan Browner ? ?Next OV: 6 months, but not scheduled ? ?Knox Saliva, PharmD, MPH, BCPS ?Clinical Pharmacist (Rheumatology and Pulmonology)  ?

## 2022-04-01 ENCOUNTER — Other Ambulatory Visit (HOSPITAL_COMMUNITY): Payer: Self-pay

## 2022-04-01 DIAGNOSIS — M19011 Primary osteoarthritis, right shoulder: Secondary | ICD-10-CM | POA: Diagnosis not present

## 2022-04-01 DIAGNOSIS — M25511 Pain in right shoulder: Secondary | ICD-10-CM | POA: Diagnosis not present

## 2022-04-01 MED ORDER — CYCLOBENZAPRINE HCL 10 MG PO TABS
10.0000 mg | ORAL_TABLET | Freq: Three times a day (TID) | ORAL | 0 refills | Status: AC
  Filled 2022-04-01: qty 30, 10d supply, fill #0

## 2022-04-02 ENCOUNTER — Other Ambulatory Visit (HOSPITAL_COMMUNITY): Payer: Self-pay

## 2022-04-08 ENCOUNTER — Other Ambulatory Visit (HOSPITAL_COMMUNITY): Payer: Self-pay

## 2022-04-09 ENCOUNTER — Other Ambulatory Visit (HOSPITAL_COMMUNITY): Payer: Self-pay

## 2022-04-12 ENCOUNTER — Other Ambulatory Visit (HOSPITAL_COMMUNITY): Payer: Self-pay

## 2022-04-17 ENCOUNTER — Other Ambulatory Visit (HOSPITAL_COMMUNITY): Payer: Self-pay

## 2022-04-22 ENCOUNTER — Telehealth: Payer: Self-pay | Admitting: Pharmacy Technician

## 2022-04-22 NOTE — Telephone Encounter (Signed)
Received notification from [facility] regarding a prior authorization for [place]. Authorization has been APPROVED from 04/22/22 to 04/22/23.   Authorization # Key: [id number] - PA Case ID: [id number]

## 2022-04-24 ENCOUNTER — Other Ambulatory Visit: Payer: Self-pay | Admitting: Orthopedic Surgery

## 2022-04-24 DIAGNOSIS — M25511 Pain in right shoulder: Secondary | ICD-10-CM

## 2022-04-30 ENCOUNTER — Other Ambulatory Visit (HOSPITAL_COMMUNITY): Payer: Self-pay

## 2022-05-01 ENCOUNTER — Other Ambulatory Visit (HOSPITAL_COMMUNITY): Payer: Self-pay

## 2022-05-08 ENCOUNTER — Other Ambulatory Visit (HOSPITAL_COMMUNITY): Payer: Self-pay

## 2022-05-09 ENCOUNTER — Other Ambulatory Visit (HOSPITAL_COMMUNITY): Payer: Self-pay

## 2022-05-13 ENCOUNTER — Other Ambulatory Visit (HOSPITAL_COMMUNITY): Payer: Self-pay

## 2022-05-13 ENCOUNTER — Telehealth: Payer: Self-pay

## 2022-05-13 NOTE — Telephone Encounter (Signed)
Received notification from [facility] regarding a prior authorization for DUPIXENT. Authorization has been APPROVED from 05/13/2022 to 05/13/2023. Approval letter sent to scan center.  Authorization # [id number]

## 2022-05-14 ENCOUNTER — Other Ambulatory Visit (HOSPITAL_COMMUNITY): Payer: Self-pay

## 2022-05-15 ENCOUNTER — Ambulatory Visit
Admission: RE | Admit: 2022-05-15 | Discharge: 2022-05-15 | Disposition: A | Discharge status: Home / Self Care | Payer: 59 | Source: Ambulatory Visit | Attending: Orthopedic Surgery | Admitting: Orthopedic Surgery

## 2022-05-15 ENCOUNTER — Other Ambulatory Visit (HOSPITAL_COMMUNITY): Payer: Self-pay

## 2022-05-15 DIAGNOSIS — M25511 Pain in right shoulder: Secondary | ICD-10-CM | POA: Insufficient documentation

## 2022-05-15 MED ORDER — PSEUDOEPHEDRINE HCL 30 MG PO TABS
ORAL_TABLET | ORAL | 0 refills | Status: DC
  Filled 2022-05-16: qty 100, 25d supply, fill #0

## 2022-05-16 ENCOUNTER — Other Ambulatory Visit (HOSPITAL_COMMUNITY): Payer: Self-pay

## 2022-05-17 ENCOUNTER — Other Ambulatory Visit (HOSPITAL_COMMUNITY): Payer: Self-pay

## 2022-05-22 ENCOUNTER — Other Ambulatory Visit (HOSPITAL_COMMUNITY): Payer: Self-pay

## 2022-05-24 ENCOUNTER — Other Ambulatory Visit (HOSPITAL_COMMUNITY): Payer: Self-pay

## 2022-05-27 ENCOUNTER — Other Ambulatory Visit (HOSPITAL_COMMUNITY): Payer: Self-pay

## 2022-05-29 ENCOUNTER — Other Ambulatory Visit (HOSPITAL_COMMUNITY): Payer: Self-pay

## 2022-05-29 ENCOUNTER — Other Ambulatory Visit: Payer: Self-pay

## 2022-05-30 ENCOUNTER — Other Ambulatory Visit (HOSPITAL_COMMUNITY): Payer: Self-pay

## 2022-05-31 ENCOUNTER — Other Ambulatory Visit (HOSPITAL_COMMUNITY): Payer: Self-pay

## 2022-05-31 ENCOUNTER — Other Ambulatory Visit: Payer: Self-pay

## 2022-06-03 ENCOUNTER — Other Ambulatory Visit (HOSPITAL_COMMUNITY): Payer: Self-pay

## 2022-06-03 DIAGNOSIS — J4520 Mild intermittent asthma, uncomplicated: Secondary | ICD-10-CM | POA: Diagnosis not present

## 2022-06-03 DIAGNOSIS — J309 Allergic rhinitis, unspecified: Secondary | ICD-10-CM | POA: Diagnosis not present

## 2022-06-03 DIAGNOSIS — Z125 Encounter for screening for malignant neoplasm of prostate: Secondary | ICD-10-CM | POA: Diagnosis not present

## 2022-06-03 DIAGNOSIS — Z0000 Encounter for general adult medical examination without abnormal findings: Secondary | ICD-10-CM | POA: Diagnosis not present

## 2022-06-03 DIAGNOSIS — E782 Mixed hyperlipidemia: Secondary | ICD-10-CM | POA: Diagnosis not present

## 2022-06-03 DIAGNOSIS — R002 Palpitations: Secondary | ICD-10-CM | POA: Diagnosis not present

## 2022-06-03 DIAGNOSIS — K219 Gastro-esophageal reflux disease without esophagitis: Secondary | ICD-10-CM | POA: Diagnosis not present

## 2022-06-03 DIAGNOSIS — R7303 Prediabetes: Secondary | ICD-10-CM | POA: Diagnosis not present

## 2022-06-03 DIAGNOSIS — I10 Essential (primary) hypertension: Secondary | ICD-10-CM | POA: Diagnosis not present

## 2022-06-03 DIAGNOSIS — G4700 Insomnia, unspecified: Secondary | ICD-10-CM | POA: Diagnosis not present

## 2022-06-05 ENCOUNTER — Other Ambulatory Visit: Payer: Self-pay | Admitting: Pharmacist

## 2022-06-05 ENCOUNTER — Other Ambulatory Visit (HOSPITAL_COMMUNITY): Payer: Self-pay

## 2022-06-05 DIAGNOSIS — J4540 Moderate persistent asthma, uncomplicated: Secondary | ICD-10-CM

## 2022-06-05 MED ORDER — DUPIXENT 200 MG/1.14ML ~~LOC~~ SOSY
200.0000 mg | PREFILLED_SYRINGE | SUBCUTANEOUS | 1 refills | Status: DC
  Filled 2022-06-05 – 2022-06-21 (×2): qty 1.14, 21d supply, fill #0
  Filled 2022-07-12: qty 1.14, 21d supply, fill #1
  Filled 2022-08-12: qty 1.14, 21d supply, fill #2
  Filled 2022-09-30: qty 1.14, 21d supply, fill #3

## 2022-06-05 MED ORDER — DUPIXENT 200 MG/1.14ML ~~LOC~~ SOSY
200.0000 mg | PREFILLED_SYRINGE | SUBCUTANEOUS | 1 refills | Status: DC
  Filled 2022-06-05: qty 2.28, 42d supply, fill #0

## 2022-06-05 NOTE — Telephone Encounter (Signed)
Refill sent for [place] to [facility]: [phone]   Dose: 200 mg every 3 weeks  Last OV: 03/13/22 Provider: Dr. Vaughan Browner  Next OV: not yet scheduled but due in October 2023  Knox Saliva, PharmD, MPH, BCPS Clinical Pharmacist (Rheumatology and Pulmonology)

## 2022-06-06 ENCOUNTER — Other Ambulatory Visit (HOSPITAL_COMMUNITY): Payer: Self-pay

## 2022-06-10 ENCOUNTER — Other Ambulatory Visit (HOSPITAL_COMMUNITY): Payer: Self-pay

## 2022-06-14 ENCOUNTER — Other Ambulatory Visit (HOSPITAL_COMMUNITY): Payer: Self-pay

## 2022-06-19 DIAGNOSIS — M19011 Primary osteoarthritis, right shoulder: Secondary | ICD-10-CM | POA: Diagnosis not present

## 2022-06-19 DIAGNOSIS — M25512 Pain in left shoulder: Secondary | ICD-10-CM | POA: Diagnosis not present

## 2022-06-21 ENCOUNTER — Other Ambulatory Visit (HOSPITAL_COMMUNITY): Payer: Self-pay

## 2022-07-01 ENCOUNTER — Other Ambulatory Visit (HOSPITAL_COMMUNITY): Payer: Self-pay

## 2022-07-10 DIAGNOSIS — Z1211 Encounter for screening for malignant neoplasm of colon: Secondary | ICD-10-CM | POA: Diagnosis not present

## 2022-07-11 ENCOUNTER — Other Ambulatory Visit (HOSPITAL_COMMUNITY): Payer: Self-pay

## 2022-07-12 ENCOUNTER — Other Ambulatory Visit (HOSPITAL_COMMUNITY): Payer: Self-pay

## 2022-07-23 ENCOUNTER — Other Ambulatory Visit (HOSPITAL_COMMUNITY): Payer: Self-pay

## 2022-07-30 ENCOUNTER — Other Ambulatory Visit (HOSPITAL_COMMUNITY): Payer: Self-pay

## 2022-07-30 MED ORDER — AZELASTINE HCL 0.1 % NA SOLN
1.0000 | Freq: Two times a day (BID) | NASAL | 3 refills | Status: DC
  Filled 2022-07-30: qty 30, 90d supply, fill #0
  Filled 2023-01-23: qty 30, 90d supply, fill #1
  Filled 2023-07-30: qty 30, 90d supply, fill #2

## 2022-07-30 MED ORDER — PSEUDOEPHEDRINE HCL 30 MG PO TABS
ORAL_TABLET | ORAL | 1 refills | Status: DC
  Filled 2022-07-30: qty 100, 25d supply, fill #0
  Filled 2022-11-06: qty 100, 25d supply, fill #1

## 2022-07-30 MED ORDER — METOPROLOL TARTRATE 50 MG PO TABS
25.0000 mg | ORAL_TABLET | Freq: Two times a day (BID) | ORAL | 3 refills | Status: DC
  Filled 2022-07-30: qty 90, 90d supply, fill #0
  Filled 2023-01-23: qty 90, 90d supply, fill #1
  Filled 2023-04-21: qty 90, 90d supply, fill #2
  Filled 2023-07-30: qty 90, 90d supply, fill #3

## 2022-07-31 ENCOUNTER — Other Ambulatory Visit (HOSPITAL_COMMUNITY): Payer: Self-pay

## 2022-08-03 ENCOUNTER — Other Ambulatory Visit (HOSPITAL_COMMUNITY): Payer: Self-pay

## 2022-08-05 ENCOUNTER — Other Ambulatory Visit (HOSPITAL_COMMUNITY): Payer: Self-pay

## 2022-08-07 ENCOUNTER — Other Ambulatory Visit (HOSPITAL_COMMUNITY): Payer: Self-pay

## 2022-08-09 ENCOUNTER — Other Ambulatory Visit (HOSPITAL_COMMUNITY): Payer: Self-pay

## 2022-08-12 ENCOUNTER — Other Ambulatory Visit (HOSPITAL_COMMUNITY): Payer: Self-pay

## 2022-08-15 ENCOUNTER — Other Ambulatory Visit (HOSPITAL_COMMUNITY): Payer: Self-pay

## 2022-08-29 ENCOUNTER — Other Ambulatory Visit (HOSPITAL_COMMUNITY): Payer: Self-pay

## 2022-09-02 ENCOUNTER — Other Ambulatory Visit (HOSPITAL_COMMUNITY): Payer: Self-pay

## 2022-09-04 ENCOUNTER — Other Ambulatory Visit (HOSPITAL_COMMUNITY): Payer: Self-pay

## 2022-09-05 ENCOUNTER — Other Ambulatory Visit (HOSPITAL_COMMUNITY): Payer: Self-pay

## 2022-09-05 MED ORDER — PSEUDOEPHEDRINE HCL 30 MG PO TABS
30.0000 mg | ORAL_TABLET | Freq: Four times a day (QID) | ORAL | 0 refills | Status: DC | PRN
  Filled 2022-09-05: qty 100, 33d supply, fill #0
  Filled 2022-09-13: qty 100, 25d supply, fill #0

## 2022-09-05 MED ORDER — ETODOLAC 400 MG PO TABS
400.0000 mg | ORAL_TABLET | Freq: Two times a day (BID) | ORAL | 0 refills | Status: DC
  Filled 2022-09-05: qty 60, 30d supply, fill #0

## 2022-09-13 ENCOUNTER — Other Ambulatory Visit (HOSPITAL_COMMUNITY): Payer: Self-pay

## 2022-09-16 ENCOUNTER — Other Ambulatory Visit (HOSPITAL_COMMUNITY): Payer: Self-pay

## 2022-09-24 ENCOUNTER — Other Ambulatory Visit (HOSPITAL_COMMUNITY): Payer: Self-pay

## 2022-09-30 ENCOUNTER — Other Ambulatory Visit (HOSPITAL_COMMUNITY): Payer: Self-pay

## 2022-10-01 ENCOUNTER — Other Ambulatory Visit (HOSPITAL_COMMUNITY): Payer: Self-pay

## 2022-10-07 ENCOUNTER — Other Ambulatory Visit (HOSPITAL_COMMUNITY): Payer: Self-pay

## 2022-10-08 ENCOUNTER — Other Ambulatory Visit (HOSPITAL_COMMUNITY): Payer: Self-pay

## 2022-10-08 MED ORDER — ETODOLAC 400 MG PO TABS
400.0000 mg | ORAL_TABLET | Freq: Two times a day (BID) | ORAL | 0 refills | Status: DC
  Filled 2022-10-08: qty 60, 30d supply, fill #0

## 2022-10-21 ENCOUNTER — Other Ambulatory Visit (HOSPITAL_COMMUNITY): Payer: Self-pay

## 2022-10-22 ENCOUNTER — Telehealth: Payer: Self-pay | Admitting: Pulmonary Disease

## 2022-10-22 ENCOUNTER — Other Ambulatory Visit (HOSPITAL_COMMUNITY): Payer: Self-pay

## 2022-10-22 DIAGNOSIS — J4540 Moderate persistent asthma, uncomplicated: Secondary | ICD-10-CM

## 2022-10-22 MED ORDER — DUPIXENT 200 MG/1.14ML ~~LOC~~ SOSY
200.0000 mg | PREFILLED_SYRINGE | SUBCUTANEOUS | 0 refills | Status: DC
  Filled 2022-10-22: qty 1.14, 21d supply, fill #0

## 2022-10-22 NOTE — Telephone Encounter (Signed)
Refill sent for [place] to [place] [facility]: [phone]   Dose: 200 mg SQ every 3 weeks  Last OV: 03/13/2022 Provider: Dr. Vaughan Browner  Next OV: not scheduled; 6 months andoverdue  Knox Saliva, PharmD, MPH, BCPS Clinical Pharmacist (Rheumatology and Pulmonology)

## 2022-10-23 ENCOUNTER — Other Ambulatory Visit (HOSPITAL_COMMUNITY): Payer: Self-pay

## 2022-10-29 ENCOUNTER — Telehealth: Payer: Self-pay | Admitting: Pulmonary Disease

## 2022-10-30 NOTE — Telephone Encounter (Signed)
Refill was already sent to [facility] on 10/22/2022.  Left VM for patient to reach out to [facility] to schedule refill if he is due. Do not see any notes in [place] except that patient was due to self-inject on 10/24/2022.  Emailed [facility]  Knox Saliva, PharmD, MPH, BCPS, CPP Clinical Pharmacist (Rheumatology and Pulmonology)

## 2022-11-04 ENCOUNTER — Telehealth: Payer: Self-pay | Admitting: Pharmacist

## 2022-11-04 ENCOUNTER — Other Ambulatory Visit (HOSPITAL_COMMUNITY): Payer: Self-pay

## 2022-11-04 NOTE — Telephone Encounter (Signed)
Called patient to schedule an appointment for the  [facility]. I was unable to reach the patient so I left a HIPAA-compliant message requesting that the patient return my call.   Luke Van Ausdall, PharmD, BCACP, CPP Clinical Pharmacist [facility] [phone]  

## 2022-11-04 NOTE — Telephone Encounter (Signed)
Patient will need to contact [facility] to move forward with [place] shipment. VM has been left by Lurena Joiner, PharmD, and Tamra, CPhT at [facility], PharmD, MPH, BCPS, CPP Clinical Pharmacist (Rheumatology and Pulmonology)

## 2022-11-05 ENCOUNTER — Other Ambulatory Visit (HOSPITAL_COMMUNITY): Payer: Self-pay

## 2022-11-05 ENCOUNTER — Other Ambulatory Visit: Payer: Self-pay

## 2022-11-05 ENCOUNTER — Ambulatory Visit: Payer: 59 | Attending: Family Medicine | Admitting: Pharmacist

## 2022-11-05 DIAGNOSIS — Z79899 Other long term (current) drug therapy: Secondary | ICD-10-CM

## 2022-11-05 DIAGNOSIS — J4540 Moderate persistent asthma, uncomplicated: Secondary | ICD-10-CM

## 2022-11-05 MED ORDER — DUPIXENT 200 MG/1.14ML ~~LOC~~ SOSY
200.0000 mg | PREFILLED_SYRINGE | SUBCUTANEOUS | 0 refills | Status: DC
  Filled 2022-11-05: qty 2.28, 42d supply, fill #0

## 2022-11-05 NOTE — Progress Notes (Signed)
   S: Patient presents for review of their specialty medication therapy.  Patient is currently taking Dupixent for asthma/chronic rhinosinusitis. Patient is managed by Dr. Vaughan Browner for this.   Adherence: reported  Efficacy: reports transformative efficacy with the Dupixent.   Dosing: 200 mg subq q14days  Dose adjustments: Renal: no dose adjustments (has not been studied) Hepatic: no dose adjustments (has not been studied)  Drug-drug interactions: none identified  Monitoring: S/sx of infection: none  S/sx of hypersensitivity:none S/sx of ocular effects: none different from his baseline S/sx of eosinophilia/vasculitis: counseling given Other side effects: some arthralgia. He is able to tolerate this.  O: Lab Results  Component Value Date   WBC 7.5 01/28/2020   HGB 15.1 01/28/2020   HCT 44.3 01/28/2020   MCV 96.9 01/28/2020   PLT 204.0 01/28/2020      Chemistry   No results found for: "NA", "K", "CL", "CO2", "BUN", "CREATININE", "GLU" No results found for: "CALCIUM", "ALKPHOS", "AST", "ALT", "BILITOT"     A/P: 1. Medication review: Patient currently on [place] for asthma/chronic rhinosinusitis and is tolerating it better with the reduction to the 200 mg dose. Reviewed the medication with the patient, including the following: Dupixent is a monoclonal antibody used for the treatment of asthma or atopic dermatitis. Patient educated on purpose, proper use and potential adverse effects of Dupixent. Possible adverse effects include increased risk of infection, ocular effects, vasculitis/eosinophilia, and hypersensitivity reactions. Administer as a SubQ injection and rotate sites. Allow the medication to reach room temp prior to administration (45 mins for 300 mg syringe or 30 min for 200 mg syringe). Do not shake. Discard any unused portion. No recommendations for any changes.   Benard Halsted, PharmD, [place], [place] [phone]

## 2022-11-06 ENCOUNTER — Other Ambulatory Visit (HOSPITAL_COMMUNITY): Payer: Self-pay

## 2022-11-07 ENCOUNTER — Other Ambulatory Visit (HOSPITAL_COMMUNITY): Payer: Self-pay

## 2022-11-07 NOTE — Telephone Encounter (Signed)
Dupixent filled 11/05/22 per dispense history. Closing encounter  Knox Saliva, PharmD, MPH, BCPS, CPP Clinical Pharmacist (Rheumatology and Pulmonology)

## 2022-11-08 ENCOUNTER — Other Ambulatory Visit (HOSPITAL_COMMUNITY): Payer: Self-pay

## 2022-11-08 MED ORDER — ETODOLAC 400 MG PO TABS
ORAL_TABLET | ORAL | 0 refills | Status: DC
  Filled 2022-11-08: qty 60, 30d supply, fill #0

## 2022-11-20 ENCOUNTER — Ambulatory Visit: Payer: Self-pay | Admitting: Primary Care

## 2022-11-21 ENCOUNTER — Other Ambulatory Visit (HOSPITAL_COMMUNITY): Payer: Self-pay

## 2022-11-22 ENCOUNTER — Other Ambulatory Visit (HOSPITAL_COMMUNITY): Payer: Self-pay

## 2022-12-03 ENCOUNTER — Ambulatory Visit: Payer: Self-pay | Admitting: Primary Care

## 2022-12-04 ENCOUNTER — Other Ambulatory Visit (HOSPITAL_COMMUNITY): Payer: Self-pay

## 2022-12-04 MED ORDER — ETODOLAC 400 MG PO TABS
ORAL_TABLET | ORAL | 1 refills | Status: DC
  Filled 2022-12-04 – 2022-12-09 (×3): qty 180, 90d supply, fill #0
  Filled 2023-02-25: qty 180, 90d supply, fill #1

## 2022-12-05 ENCOUNTER — Other Ambulatory Visit (HOSPITAL_COMMUNITY): Payer: Self-pay

## 2022-12-09 ENCOUNTER — Other Ambulatory Visit (HOSPITAL_COMMUNITY): Payer: Self-pay

## 2022-12-10 ENCOUNTER — Encounter: Payer: Self-pay | Admitting: Pharmacist

## 2022-12-10 ENCOUNTER — Other Ambulatory Visit: Payer: Self-pay

## 2022-12-12 ENCOUNTER — Telehealth: Payer: Self-pay | Admitting: Primary Care

## 2022-12-12 ENCOUNTER — Ambulatory Visit: Payer: 59 | Admitting: Primary Care

## 2022-12-12 ENCOUNTER — Encounter: Payer: Self-pay | Admitting: Primary Care

## 2022-12-12 ENCOUNTER — Other Ambulatory Visit: Payer: Self-pay

## 2022-12-12 ENCOUNTER — Other Ambulatory Visit (HOSPITAL_COMMUNITY): Payer: Self-pay

## 2022-12-12 VITALS — BP 142/62 | HR 72 | Temp 98.1°F | Ht 70.0 in | Wt 204.0 lb

## 2022-12-12 DIAGNOSIS — H9312 Tinnitus, left ear: Secondary | ICD-10-CM | POA: Diagnosis not present

## 2022-12-12 DIAGNOSIS — H9319 Tinnitus, unspecified ear: Secondary | ICD-10-CM | POA: Insufficient documentation

## 2022-12-12 DIAGNOSIS — H6123 Impacted cerumen, bilateral: Secondary | ICD-10-CM | POA: Diagnosis not present

## 2022-12-12 DIAGNOSIS — J329 Chronic sinusitis, unspecified: Secondary | ICD-10-CM | POA: Diagnosis not present

## 2022-12-12 DIAGNOSIS — J4520 Mild intermittent asthma, uncomplicated: Secondary | ICD-10-CM | POA: Diagnosis not present

## 2022-12-12 DIAGNOSIS — J4540 Moderate persistent asthma, uncomplicated: Secondary | ICD-10-CM | POA: Diagnosis not present

## 2022-12-12 MED ORDER — AIRSUPRA 90-80 MCG/ACT IN AERO
2.0000 | INHALATION_SPRAY | RESPIRATORY_TRACT | 3 refills | Status: AC | PRN
  Filled 2022-12-12: qty 10.7, 15d supply, fill #0
  Filled 2023-04-21: qty 10.7, 15d supply, fill #1

## 2022-12-12 NOTE — Patient Instructions (Addendum)
Recommendations: Continue Flonase and Astelin nasal spray Continue Claritin '10mg'$  daily Continue Dupixent injections Use Albuterol 2 puffs every 4-6 hours as needed for shortness of breath/wheezing or cough If needing to use albuterol more than once a day or you have any respiratory symptoms please notify office    Referral: ENT   Follow-up: 1 year with Dr. Vaughan Browner or sooner if needed

## 2022-12-12 NOTE — Assessment & Plan Note (Signed)
-  Patient has tinnitus symptoms in left ear with associated cerumen impaction bilaterally - Referring to ENT

## 2022-12-12 NOTE — Progress Notes (Signed)
$'@Patient'G$  ID: Jason Schneider, male    DOB: 04-13-1961, 62 y.o.   MRN: [id number]  Chief Complaint  Patient presents with   Follow-up    Needs medication refill    Referring provider: Shirline Frees, MD  HPI: 62 year old with history of asthma, chronic rhinosinusitis, allergies  Was followed by allergist in his teens and was receiving allergy shots but not recently He has history of significant chronic nasal congestion, postnasal discharge and chronic sinus complaints of headache and drainage. He underwent previous septoplasty and balloon sinus plasty with limited improvement in symptoms. CT scan shows diffuse mucosal disease involving the maxillary and ethmoid sinuses bilaterally with extension to the nasal frontal recess.  Has significant issues with chronic sinus congestion, recurrent sinus exacerbations. He is on nasal spray, pseudoephedrine. The only thing that helps is prednisone. He is also on albuterol for his asthma. Not on maintenance inhalers.  Started on [place] in April 2021, has occasional blurring of vision.  Followed up with ophthalmology Reports seeing ENT who did a scope and noted nasal polyposis. COVID-19 in January 2021.  Did not require hospitalization and self treated at home.  Pets: No pets Occupation: Engineer, structural Exposures: No known exposures. No mold, hot tub, Jacuzzi. No down pillows or comforter Smoking history: Never smoker Travel history: No significant travel history Relevant family history: No significant family of lung disease  Interim history: Continues to have problems of tremors with the dupixent.  Dose reduced to 200 mg every 2 weeks which improved symptoms but after some time it started recurring so now he is taking Dupixent at a dose of 200 mg every 3 weeks  Even at this dose his symptoms are well controlled.  States that sinusitis symptoms have improved  12/12/2022 Patient presents today for regular follow-up/medication refill.   PMH chronic sinusitis/asthma PFTs and CXR normal Dupixent has been helping, sinusitis symptoms have improved quite a bit. No medication reactions.  Asthma symptoms are not problematic for him. May flare in spring.  He is sensitive to chemicals and strong smells. Not as bad as it used to be.  No chronic cough unless he has acute sinus issues He uses astelin and flonase as prescribed  He stopped Symbicort d/t having joint pain and fatigue while taking. Since he stopped Symbicort symptoms resovled.  No wheezing, chest tightness or cough No Saba use    No Known Allergies   There is no immunization history on file for this patient.  Past Medical History:  Diagnosis Date   GERD (gastroesophageal reflux disease)    High blood pressure    Lumbar herniated disc    Palpitations     Tobacco History: Social History   Tobacco Use  Smoking Status Never  Smokeless Tobacco Never   Counseling given: Not Answered   Outpatient Medications Prior to Visit  Medication Sig Dispense Refill   azelastine (ASTELIN) 0.1 % nasal spray Place 1 spray into both nostrils 2 (two) times daily. 90 mL 3   dupilumab (DUPIXENT) 200 MG/1.14ML prefilled syringe Inject 200 mg into the skin every 21 ( twenty-one) days. 2.28 mL 0   esomeprazole (NEXIUM) 40 MG capsule Take 1 capsule (40 mg total) by mouth daily. 90 capsule 1   etodolac (LODINE) 400 MG tablet Take 1 tablet (400 mg total) by mouth 2 (two) times daily. 180 tablet 3   fluticasone (FLONASE) 50 MCG/ACT nasal spray Place 2 sprays into both nostrils daily.     loratadine (CLARITIN) 10 MG tablet  Take 10 mg by mouth daily.     metoprolol tartrate (LOPRESSOR) 50 MG tablet Take 0.5 tablets (25 mg total) by mouth 2 (two) times daily. 90 tablet 3   Multiple Vitamin (MULTIVITAMIN) capsule Take 1 capsule by mouth daily.     NEEDLE, DISP, 23 G (BD DISP NEEDLE) 23G X 1" MISC Use to inject testosterone 10 each 3   pseudoephedrine (SUDOGEST) 30 MG tablet Take 1  tablet (30 mg total) by mouth every 6 (six) hours as needed. 100 tablet 1   SYRINGE-NEEDLE, DISP, 3 ML (B-D 3CC LUER-LOK SYR 18GX1-1/2) 18G X 1-1/2" 3 ML MISC Use to draw testosterone twice a week 10 each 3   testosterone cypionate (DEPOTESTOSTERONE CYPIONATE) 200 MG/ML injection INJECT 0.5 ML TWICE WEEKLY INTO THIGH (SINGLE USE VIALS) 10 mL 3   albuterol (VENTOLIN HFA) 108 (90 Base) MCG/ACT inhaler INHALE 2 PUFFS BY MOUTH EVERY 6 HOURS AS NEEDED 18 g 1   ALPRAZolam (XANAX) 0.25 MG tablet Take 1 tablet (0.25 mg total) by mouth 2 (two) times daily as needed for anxiety. (Patient not taking: Reported on 03/13/2022) 6 tablet 0   azelastine (ASTELIN) 0.1 % nasal spray Place 1 spray into both nostrils 2 (two) times daily. 90 mL 3   cyclobenzaprine (FLEXERIL) 10 MG tablet Take 1 tablet (10 mg total) by mouth 3 (three) times daily. (Patient not taking: Reported on 12/12/2022) 30 tablet 0   etodolac (LODINE) 400 MG tablet Take 1 tablet by mouth twice a day 180 tablet 1   levalbuterol (XOPENEX) 0.63 MG/3ML nebulizer solution INHALE 1 VIAL BY NEBULIZER EVERY 8 HOURS AS NEEDED FOR WHEEZING. 72 mL 6   metoprolol tartrate (LOPRESSOR) 50 MG tablet Take 0.5 tablets (25 mg total) by mouth 2 (two) times daily. 90 tablet 3   pseudoephedrine (SUDOGEST) 30 MG tablet Take 1 tablet (30 mg total) by mouth every 6 (six) hours as needed. 100 tablet 0   budesonide-formoterol (SYMBICORT) 80-4.5 MCG/ACT inhaler INHALE 2 PUFFS INTO THE LUNGS IN THE MORNING AND AT BEDTIME. (Patient not taking: Reported on 12/12/2022) 10.2 g 5   No facility-administered medications prior to visit.   Review of Systems  Review of Systems  Constitutional: Negative.   HENT:  Positive for congestion and postnasal drip.   Respiratory:  Negative for cough, chest tightness, shortness of breath and wheezing.   Cardiovascular: Negative.      Physical Exam  BP (!) 142/62 (BP Location: Right Arm, Cuff Size: Normal)   Pulse 72   Temp 98.1 F (36.7  C) (Temporal)   Ht '5\' 10"'$  (1.778 m)   Wt 204 lb (92.5 kg)   SpO2 95%   BMI 29.27 kg/m  Physical Exam Constitutional:      Appearance: Normal appearance.  HENT:     Head: Normocephalic and atraumatic.     Right Ear: There is impacted cerumen.     Left Ear: There is impacted cerumen.  Pulmonary:     Effort: Pulmonary effort is normal.     Breath sounds: Normal breath sounds. No wheezing, rhonchi or rales.     Comments: CTA Neurological:     Mental Status: He is alert.      Lab Results:  CBC    Component Value Date/Time   WBC 7.5 01/28/2020 1501   RBC 4.58 01/28/2020 1501   HGB 15.1 01/28/2020 1501   HCT 44.3 01/28/2020 1501   PLT 204.0 01/28/2020 1501   MCV 96.9 01/28/2020 1501   MCHC 34.0 01/28/2020  1501   RDW 14.3 01/28/2020 1501   LYMPHSABS 1.3 01/28/2020 1501   MONOABS 0.9 01/28/2020 1501   EOSABS 0.4 01/28/2020 1501   BASOSABS 0.1 01/28/2020 1501    BMET No results found for: "NA", "K", "CL", "CO2", "GLUCOSE", "BUN", "CREATININE", "CALCIUM", "GFRNONAA", "GFRAA"  BNP No results found for: "BNP"  ProBNP No results found for: "PROBNP"  Imaging: No results found.   Assessment & Plan:   Mild intermittent asthma with allergic rhinitis - Well controlled; Symptoms are mild and intermittent. No SABA use.  - Pulmonary function testing normal  - He did not tolerate Symbicort d/t fatigue and arthralgia symptoms  - Change to [place]spura to use as needed 2 puffs every 4-6 hours (not to exceed 12 doses) - Continue Loratidine '10mg'$  daily  - RX Dupixent '200mg'$  q 21 days sent to pharmacy   Chronic sinusitis - Chronic sinus symptoms have significantly improved on Dupixent  - Continue Astelin and Flonase nasal spray   Tinnitus - Patient has tinnitus symptoms in left ear with associated cerumen impaction bilaterally - Referring to ENT    Martyn Ehrich, NP 12/12/2022

## 2022-12-12 NOTE — Telephone Encounter (Signed)
Needs refill Dupixent, seen today for annual visit

## 2022-12-12 NOTE — Assessment & Plan Note (Addendum)
-  Well controlled; Symptoms are mild and intermittent. No SABA use.  - Pulmonary function testing normal  - He did not tolerate Symbicort d/t fatigue and arthralgia symptoms  - Change to Airspura to use as needed 2 puffs every 4-6 hours (not to exceed 12 doses) - Continue Loratidine '10mg'$  daily  - RX Dupixent '200mg'$  q 21 days sent to pharmacy

## 2022-12-12 NOTE — Assessment & Plan Note (Addendum)
-  Chronic sinus symptoms have significantly improved on Dupixent  - Continue Astelin and Flonase nasal spray

## 2022-12-13 ENCOUNTER — Other Ambulatory Visit (HOSPITAL_COMMUNITY): Payer: Self-pay

## 2022-12-13 MED ORDER — DUPIXENT 200 MG/1.14ML ~~LOC~~ SOSY
200.0000 mg | PREFILLED_SYRINGE | SUBCUTANEOUS | 5 refills | Status: DC
  Filled 2022-12-13 – 2022-12-16 (×2): qty 2.28, 42d supply, fill #0

## 2022-12-13 NOTE — Telephone Encounter (Signed)
Refill sent for [place] to [facility]: [phone]   Dose: 200 mg every 3 weeks  Last OV: 12/12/2022 Provider: Dr. Vaughan Browner  Next OV: 1 year  Knox Saliva, PharmD, MPH, BCPS Clinical Pharmacist (Rheumatology and Pulmonology)

## 2022-12-15 ENCOUNTER — Other Ambulatory Visit (HOSPITAL_COMMUNITY): Payer: Self-pay

## 2022-12-16 ENCOUNTER — Other Ambulatory Visit (HOSPITAL_COMMUNITY): Payer: Self-pay

## 2022-12-18 ENCOUNTER — Other Ambulatory Visit: Payer: Self-pay | Admitting: Pharmacist

## 2022-12-18 ENCOUNTER — Other Ambulatory Visit (HOSPITAL_COMMUNITY): Payer: Self-pay

## 2022-12-18 DIAGNOSIS — J4540 Moderate persistent asthma, uncomplicated: Secondary | ICD-10-CM

## 2022-12-18 MED ORDER — DUPIXENT 200 MG/1.14ML ~~LOC~~ SOSY
200.0000 mg | PREFILLED_SYRINGE | SUBCUTANEOUS | 5 refills | Status: DC
  Filled 2022-12-18 – 2022-12-19 (×2): qty 2.28, 42d supply, fill #0
  Filled 2022-12-20: qty 1.14, 21d supply, fill #0
  Filled 2023-01-28: qty 1.14, 21d supply, fill #1
  Filled 2023-02-20: qty 2.28, 42d supply, fill #2
  Filled 2023-04-04 – 2023-04-07 (×2): qty 2.28, 42d supply, fill #3
  Filled 2023-05-16 – 2023-05-23 (×2): qty 2.28, 30d supply, fill #4
  Filled 2023-05-23: qty 2.28, 42d supply, fill #4
  Filled 2023-05-23: qty 2.28, 30d supply, fill #4
  Filled 2023-05-23 – 2023-05-26 (×2): qty 1.14, 21d supply, fill #4
  Filled 2023-07-04: qty 1.14, 21d supply, fill #5

## 2022-12-19 ENCOUNTER — Other Ambulatory Visit (HOSPITAL_COMMUNITY): Payer: Self-pay

## 2022-12-20 ENCOUNTER — Other Ambulatory Visit (HOSPITAL_COMMUNITY): Payer: Self-pay

## 2022-12-23 ENCOUNTER — Other Ambulatory Visit: Payer: Self-pay

## 2022-12-26 ENCOUNTER — Other Ambulatory Visit (HOSPITAL_COMMUNITY): Payer: Self-pay

## 2022-12-27 ENCOUNTER — Other Ambulatory Visit (HOSPITAL_COMMUNITY): Payer: Self-pay

## 2022-12-27 MED ORDER — PSEUDOEPHEDRINE HCL 30 MG PO TABS
30.0000 mg | ORAL_TABLET | Freq: Four times a day (QID) | ORAL | 1 refills | Status: DC | PRN
  Filled 2022-12-27: qty 100, 25d supply, fill #0
  Filled 2023-02-25: qty 100, 25d supply, fill #1

## 2022-12-30 ENCOUNTER — Other Ambulatory Visit: Payer: Self-pay

## 2023-01-01 ENCOUNTER — Other Ambulatory Visit (HOSPITAL_COMMUNITY): Payer: Self-pay

## 2023-01-23 ENCOUNTER — Other Ambulatory Visit (HOSPITAL_COMMUNITY): Payer: Self-pay

## 2023-01-28 ENCOUNTER — Other Ambulatory Visit (HOSPITAL_COMMUNITY): Payer: Self-pay

## 2023-01-29 ENCOUNTER — Other Ambulatory Visit (HOSPITAL_COMMUNITY): Payer: Self-pay

## 2023-02-20 ENCOUNTER — Other Ambulatory Visit (HOSPITAL_COMMUNITY): Payer: Self-pay

## 2023-02-21 ENCOUNTER — Other Ambulatory Visit (HOSPITAL_COMMUNITY): Payer: Self-pay

## 2023-02-21 ENCOUNTER — Other Ambulatory Visit: Payer: Self-pay

## 2023-02-25 ENCOUNTER — Other Ambulatory Visit: Payer: Self-pay

## 2023-03-31 ENCOUNTER — Other Ambulatory Visit: Payer: Self-pay

## 2023-04-02 ENCOUNTER — Other Ambulatory Visit: Payer: Self-pay

## 2023-04-04 ENCOUNTER — Other Ambulatory Visit: Payer: Self-pay

## 2023-04-07 ENCOUNTER — Other Ambulatory Visit (HOSPITAL_COMMUNITY): Payer: Self-pay

## 2023-04-07 ENCOUNTER — Other Ambulatory Visit: Payer: Self-pay

## 2023-04-21 ENCOUNTER — Other Ambulatory Visit (HOSPITAL_COMMUNITY): Payer: Self-pay

## 2023-04-22 ENCOUNTER — Other Ambulatory Visit (HOSPITAL_COMMUNITY): Payer: Self-pay

## 2023-04-22 ENCOUNTER — Other Ambulatory Visit: Payer: Self-pay

## 2023-04-29 ENCOUNTER — Other Ambulatory Visit (HOSPITAL_COMMUNITY): Payer: Self-pay

## 2023-04-30 ENCOUNTER — Other Ambulatory Visit (HOSPITAL_COMMUNITY): Payer: Self-pay

## 2023-05-01 ENCOUNTER — Other Ambulatory Visit (HOSPITAL_COMMUNITY): Payer: Self-pay

## 2023-05-01 MED ORDER — PSEUDOEPHEDRINE HCL 30 MG PO TABS
30.0000 mg | ORAL_TABLET | Freq: Four times a day (QID) | ORAL | 1 refills | Status: DC | PRN
  Filled 2023-05-02: qty 100, 25d supply, fill #0
  Filled 2023-07-30: qty 100, 25d supply, fill #1

## 2023-05-02 ENCOUNTER — Other Ambulatory Visit: Payer: Self-pay

## 2023-05-02 ENCOUNTER — Other Ambulatory Visit (HOSPITAL_COMMUNITY): Payer: Self-pay

## 2023-05-14 ENCOUNTER — Other Ambulatory Visit (HOSPITAL_COMMUNITY): Payer: Self-pay

## 2023-05-16 ENCOUNTER — Other Ambulatory Visit (HOSPITAL_COMMUNITY): Payer: Self-pay

## 2023-05-19 ENCOUNTER — Other Ambulatory Visit (HOSPITAL_COMMUNITY): Payer: Self-pay

## 2023-05-19 ENCOUNTER — Telehealth: Payer: Self-pay | Admitting: Pharmacy Technician

## 2023-05-19 ENCOUNTER — Other Ambulatory Visit: Payer: Self-pay

## 2023-05-19 NOTE — Telephone Encounter (Signed)
Submitted a Prior Authorization request to [facility] for DUPIXENT via CoverMyMeds. Will update once we receive a response.  Key: [id number] - PA Case ID: [id number]

## 2023-05-21 ENCOUNTER — Other Ambulatory Visit (HOSPITAL_COMMUNITY): Payer: Self-pay

## 2023-05-21 NOTE — Telephone Encounter (Signed)
Patient Advocate Encounter  Prior Authorization for Dupixent 200mg  has been approved with [employer].    PA# (Key: [id number]) PA Case ID #: [id number]  Effective dates: 05/21/23 through 05/19/24

## 2023-05-23 ENCOUNTER — Other Ambulatory Visit (HOSPITAL_COMMUNITY): Payer: Self-pay

## 2023-05-26 ENCOUNTER — Other Ambulatory Visit: Payer: Self-pay

## 2023-05-26 ENCOUNTER — Other Ambulatory Visit (HOSPITAL_COMMUNITY): Payer: Self-pay

## 2023-06-10 ENCOUNTER — Other Ambulatory Visit (HOSPITAL_COMMUNITY): Payer: Self-pay

## 2023-06-11 ENCOUNTER — Other Ambulatory Visit (HOSPITAL_COMMUNITY): Payer: Self-pay

## 2023-06-12 ENCOUNTER — Other Ambulatory Visit (HOSPITAL_COMMUNITY): Payer: Self-pay

## 2023-06-12 ENCOUNTER — Other Ambulatory Visit: Payer: Self-pay

## 2023-06-12 DIAGNOSIS — J4520 Mild intermittent asthma, uncomplicated: Secondary | ICD-10-CM | POA: Diagnosis not present

## 2023-06-12 DIAGNOSIS — R7303 Prediabetes: Secondary | ICD-10-CM | POA: Diagnosis not present

## 2023-06-12 DIAGNOSIS — M549 Dorsalgia, unspecified: Secondary | ICD-10-CM | POA: Diagnosis not present

## 2023-06-12 DIAGNOSIS — Z125 Encounter for screening for malignant neoplasm of prostate: Secondary | ICD-10-CM | POA: Diagnosis not present

## 2023-06-12 DIAGNOSIS — Z1211 Encounter for screening for malignant neoplasm of colon: Secondary | ICD-10-CM | POA: Diagnosis not present

## 2023-06-12 DIAGNOSIS — Z0000 Encounter for general adult medical examination without abnormal findings: Secondary | ICD-10-CM | POA: Diagnosis not present

## 2023-06-12 DIAGNOSIS — R002 Palpitations: Secondary | ICD-10-CM | POA: Diagnosis not present

## 2023-06-12 DIAGNOSIS — H6123 Impacted cerumen, bilateral: Secondary | ICD-10-CM | POA: Diagnosis not present

## 2023-06-12 DIAGNOSIS — E782 Mixed hyperlipidemia: Secondary | ICD-10-CM | POA: Diagnosis not present

## 2023-06-12 DIAGNOSIS — K219 Gastro-esophageal reflux disease without esophagitis: Secondary | ICD-10-CM | POA: Diagnosis not present

## 2023-06-12 MED ORDER — CYCLOBENZAPRINE HCL 10 MG PO TABS
10.0000 mg | ORAL_TABLET | Freq: Every evening | ORAL | 3 refills | Status: DC | PRN
  Filled 2023-06-12: qty 30, 30d supply, fill #0

## 2023-06-12 MED ORDER — ETODOLAC 400 MG PO TABS
400.0000 mg | ORAL_TABLET | Freq: Two times a day (BID) | ORAL | 0 refills | Status: DC
  Filled 2023-06-12: qty 180, 90d supply, fill #0

## 2023-06-12 MED ORDER — ESOMEPRAZOLE MAGNESIUM 40 MG PO CPDR
40.0000 mg | DELAYED_RELEASE_CAPSULE | Freq: Every day | ORAL | 6 refills | Status: DC | PRN
  Filled 2023-06-12: qty 30, 30d supply, fill #0
  Filled 2024-04-11: qty 30, 30d supply, fill #1

## 2023-06-13 ENCOUNTER — Other Ambulatory Visit: Payer: Self-pay

## 2023-06-19 ENCOUNTER — Other Ambulatory Visit: Payer: Self-pay

## 2023-06-25 ENCOUNTER — Other Ambulatory Visit (HOSPITAL_COMMUNITY): Payer: Self-pay

## 2023-07-01 ENCOUNTER — Other Ambulatory Visit (HOSPITAL_COMMUNITY): Payer: Self-pay

## 2023-07-02 DIAGNOSIS — Z1211 Encounter for screening for malignant neoplasm of colon: Secondary | ICD-10-CM | POA: Diagnosis not present

## 2023-07-04 ENCOUNTER — Other Ambulatory Visit (HOSPITAL_COMMUNITY): Payer: Self-pay

## 2023-07-23 ENCOUNTER — Other Ambulatory Visit (HOSPITAL_COMMUNITY): Payer: Self-pay

## 2023-07-25 ENCOUNTER — Other Ambulatory Visit (HOSPITAL_COMMUNITY): Payer: Self-pay

## 2023-07-28 ENCOUNTER — Other Ambulatory Visit (HOSPITAL_COMMUNITY): Payer: Self-pay

## 2023-07-28 ENCOUNTER — Encounter (HOSPITAL_COMMUNITY): Payer: Self-pay

## 2023-07-30 ENCOUNTER — Other Ambulatory Visit: Payer: Self-pay

## 2023-07-31 ENCOUNTER — Other Ambulatory Visit (HOSPITAL_COMMUNITY): Payer: Self-pay

## 2023-07-31 ENCOUNTER — Other Ambulatory Visit: Payer: Self-pay

## 2023-07-31 MED ORDER — METOPROLOL TARTRATE 50 MG PO TABS
25.0000 mg | ORAL_TABLET | Freq: Two times a day (BID) | ORAL | 1 refills | Status: DC
  Filled 2023-07-31: qty 90, 90d supply, fill #0

## 2023-07-31 MED ORDER — AZELASTINE HCL 137 MCG/SPRAY NA SOLN
1.0000 | Freq: Two times a day (BID) | NASAL | 1 refills | Status: AC
  Filled 2023-07-31: qty 90, 90d supply, fill #0
  Filled 2024-07-07: qty 90, 90d supply, fill #1

## 2023-08-01 ENCOUNTER — Other Ambulatory Visit (HOSPITAL_COMMUNITY): Payer: Self-pay

## 2023-08-08 NOTE — Telephone Encounter (Signed)
There is 2-3% incidence rate for arthralgias for Dupixent. He's been on Dupixent for many years now and don't see reports of arthralgias since starting.  Patient already on low-dose of Dupixent since he believes Dupixent was causing tremors. IF he plans to come off of treatment anyways, he'll need an appt to discuss options  Chesley Mires, PharmD, MPH, BCPS, CPP Clinical Pharmacist (Rheumatology and Pulmonology)

## 2023-08-08 NOTE — Telephone Encounter (Signed)
What is the incidence rate for arthralgia with dupixent?

## 2023-08-08 NOTE — Telephone Encounter (Signed)
Amy can you let patient know that there is a 2-3% incident rate of arthralgia with dupixent, its unlikely that medication is the cause if he has been on it for several years. If he would like to discuss coming off medication needs appointment

## 2023-08-13 ENCOUNTER — Other Ambulatory Visit (HOSPITAL_COMMUNITY): Payer: Self-pay

## 2023-08-14 ENCOUNTER — Other Ambulatory Visit: Payer: Self-pay

## 2023-08-15 ENCOUNTER — Encounter: Payer: Self-pay | Admitting: Primary Care

## 2023-08-15 ENCOUNTER — Other Ambulatory Visit (HOSPITAL_COMMUNITY): Payer: Self-pay

## 2023-08-15 ENCOUNTER — Ambulatory Visit (INDEPENDENT_AMBULATORY_CARE_PROVIDER_SITE_OTHER): Payer: 59 | Admitting: Primary Care

## 2023-08-15 VITALS — BP 110/72 | HR 68 | Wt 204.0 lb

## 2023-08-15 DIAGNOSIS — J320 Chronic maxillary sinusitis: Secondary | ICD-10-CM | POA: Diagnosis not present

## 2023-08-15 MED ORDER — DOXYCYCLINE HYCLATE 100 MG PO TABS
100.0000 mg | ORAL_TABLET | Freq: Two times a day (BID) | ORAL | 0 refills | Status: DC
  Filled 2023-08-15 (×2): qty 14, 7d supply, fill #0

## 2023-08-15 NOTE — Progress Notes (Unsigned)
@Patient  ID: Jason Schneider, male    DOB: December 13, 1960, 62 y.o.   MRN: [id number]  No chief complaint on file.   Referring provider: Noberto Retort, MD  HPI:  62 year old with history of asthma, chronic rhinosinusitis, allergies  Was followed by allergist in his teens and was receiving allergy shots but not recently He has history of significant chronic nasal congestion, postnasal discharge and chronic sinus complaints of headache and drainage. He underwent previous septoplasty and balloon sinus plasty with limited improvement in symptoms. CT scan shows diffuse mucosal disease involving the maxillary and ethmoid sinuses bilaterally with extension to the nasal frontal recess.  Has significant issues with chronic sinus congestion, recurrent sinus exacerbations. He is on nasal spray, pseudoephedrine. The only thing that helps is prednisone. He is also on albuterol for his asthma. Not on maintenance inhalers.  Started on Dupixent in April 2021, has occasional blurring of vision.  Followed up with ophthalmology Reports seeing ENT who did a scope and noted nasal polyposis. COVID-19 in January 2021.  Did not require hospitalization and self treated at home.  Pets: No pets Occupation: Engineer, civil (consulting) Exposures: No known exposures. No mold, hot tub, Jacuzzi. No down pillows or comforter Smoking history: Never smoker Travel history: No significant travel history Relevant family history: No significant family of lung disease  Interim history: Continues to have problems of tremors with the dupixent.  Dose reduced to 200 mg every 2 weeks which improved symptoms but after some time it started recurring so now he is taking Dupixent at a dose of 200 mg every 3 weeks  Even at this dose his symptoms are well controlled.  States that sinusitis symptoms have improved  12/12/2022 Patient presents today for regular follow-up/medication refill.  PMH chronic sinusitis/asthma PFTs and CXR  normal Dupixent has been helping, sinusitis symptoms have improved quite a bit. No medication reactions.  Asthma symptoms are not problematic for him. May flare in spring.  He is sensitive to chemicals and strong smells. Not as bad as it used to be.  No chronic cough unless he has acute sinus issues He uses astelin and flonase as prescribed  He stopped Symbicort d/t having joint pain and fatigue while taking. Since he stopped Symbicort symptoms resovled.  No wheezing, chest tightness or cough No Saba use     08/15/2023 Patient presents today for 87-month follow-up for asthma/chronic sinusitis.  Symptoms typically flare in the spring.  He is sensitive to chemicals and strong smells.  PFTs and chest x-ray have been normal.  He was did not tolerate Symbicort due to joint pain and fatigue. He is maintained on Dupixent, Loratadine, Astelin and Flonase as directed.  No cough unless he has acute sinus issues. During his last office visit in January he was referred to ENT due to tinnitus. He did not end up seeing ENT, his tinnitus symptoms come and go. His primary care flushed his ears.    He reports dupixent has helped with sinusitis but he has been having a lot of joint pain. He feels medication may be causing. PCP did some lab work that came back normal.  Sinus pressure and headaches started 3 weeks ago. No getting up purulent mucus right now.   He is taking Claritin, pseudoephedrine, flonase/astelin and doing sinus rinses daily. He did not tolerate Singulair (montelukast)  Last abx was 1 year ago   He   Sinus    Chronic sinusitis improved with dupixent Continue Astelin and flonase  No  Known Allergies   There is no immunization history on file for this patient.  Past Medical History:  Diagnosis Date   GERD (gastroesophageal reflux disease)    High blood pressure    Lumbar herniated disc    Palpitations     Tobacco History: Social History   Tobacco Use  Smoking Status Never   Smokeless Tobacco Never   Counseling given: Not Answered   Outpatient Medications Prior to Visit  Medication Sig Dispense Refill   albuterol (VENTOLIN HFA) 108 (90 Base) MCG/ACT inhaler INHALE 2 PUFFS BY MOUTH EVERY 6 HOURS AS NEEDED 18 g 1   Albuterol-Budesonide (AIRSUPRA) 90-80 MCG/ACT AERO Inhale 2 puffs into the lungs every 4 (four) hours as needed. 10.7 g 3   ALPRAZolam (XANAX) 0.25 MG tablet Take 1 tablet (0.25 mg total) by mouth 2 (two) times daily as needed for anxiety. (Patient not taking: Reported on 03/13/2022) 6 tablet 0   azelastine (ASTELIN) 0.1 % nasal spray Place 1 spray into both nostrils 2 (two) times daily. 90 mL 3   Azelastine HCl 137 MCG/SPRAY SOLN Place 1 spray into both nostrils 2 (two) times daily. 90 mL 1   cyclobenzaprine (FLEXERIL) 10 MG tablet Take 1 tablet (10 mg total) by mouth 3 (three) times daily. (Patient not taking: Reported on 12/12/2022) 30 tablet 0   cyclobenzaprine (FLEXERIL) 10 MG tablet Take 1 tablet (10 mg total) by mouth at bedtime as needed for muscle spasm 30 tablet 3   dupilumab (DUPIXENT) 200 MG/1. prefilled syringe Inject 200 mg into the skin every 21 ( twenty-one) days. 2.28 mL 5   esomeprazole (NEXIUM) 40 MG capsule Take 1 capsule (40 mg total) by mouth daily. 90 capsule 1   esomeprazole (NEXIUM) 40 MG capsule Take 1 capsule (40 mg total) by mouth daily as needed for GERD. 30 capsule 6   etodolac (LODINE) 400 MG tablet Take 1 tablet (400 mg total) by mouth 2 (two) times daily. 180 tablet 3   etodolac (LODINE) 400 MG tablet Take 1 tablet (400 mg total) by mouth 2 (two) times daily with food. 180 tablet 0   fluticasone (FLONASE) 50 MCG/ACT nasal spray Place 2 sprays into both nostrils daily.     levalbuterol (XOPENEX) 0.63 MG/3ML nebulizer solution INHALE 1 VIAL BY NEBULIZER EVERY 8 HOURS AS NEEDED FOR WHEEZING. 72 mL 6   loratadine (CLARITIN) 10 MG tablet Take 10 mg by mouth daily.     metoprolol tartrate (LOPRESSOR) 50 MG tablet Take 0.5  tablets (25 mg total) by mouth 2 (two) times daily. 90 tablet 3   metoprolol tartrate (LOPRESSOR) 50 MG tablet Take 1/2 tablet (25 mg total) by mouth 2 (two) times daily. 90 tablet 1   Multiple Vitamin (MULTIVITAMIN) capsule Take 1 capsule by mouth daily.     NEEDLE, DISP, 23 G (BD DISP NEEDLE) 23G X 1" MISC Use to inject testosterone 10 each 3   pseudoephedrine (SUDOGEST) 30 MG tablet Take 1 tablet (30 mg total) by mouth every 6 (six) hours as needed. 100 tablet 0   pseudoephedrine (SUDOGEST) 30 MG tablet Take 1 tablet (30 mg total) by mouth every 6 (six) hours as needed. 100 tablet 1   SYRINGE-NEEDLE, DISP, 3 ML (B-D 3CC LUER-LOK SYR 18GX1-1/2) 18G X 1-1/2" 3 ML MISC Use to draw testosterone twice a week 10 each 3   testosterone cypionate (DEPOTESTOSTERONE CYPIONATE) 200 MG/ML injection INJECT 0.5 ML TWICE WEEKLY INTO THIGH (SINGLE USE VIALS) 10 mL 3   No facility-administered  medications prior to visit.      Review of Systems  Review of Systems   Physical Exam  There were no vitals taken for this visit. Physical Exam   Lab Results:  CBC    Component Value Date/Time   WBC 7.5 01/28/2020 1501   RBC 4.58 01/28/2020 1501   HGB 15.1 01/28/2020 1501   HCT 44.3 01/28/2020 1501   PLT 204.0 01/28/2020 1501   MCV 96.9 01/28/2020 1501   MCHC 34.0 01/28/2020 1501   RDW 14.3 01/28/2020 1501   LYMPHSABS 1.3 01/28/2020 1501   MONOABS 0.9 01/28/2020 1501   EOSABS 0.4 01/28/2020 1501   BASOSABS 0.1 01/28/2020 1501    BMET No results found for: "NA", "K", "CL", "CO2", "GLUCOSE", "BUN", "CREATININE", "CALCIUM", "GFRNONAA", "GFRAA"  BNP No results found for: "BNP"  ProBNP No results found for: "PROBNP"  Imaging: No results found.   Assessment & Plan:   No problem-specific Assessment & Plan notes found for this encounter.     Glenford Bayley, NP 08/15/2023

## 2023-08-15 NOTE — Patient Instructions (Addendum)
  Ok to stop Dupixent, plan follow-up in 3 months.  If joint pain does not resolve off medication we can discuss resuming at follow-up  Follow-up: 3-4 months with [facility] NP   Sinus Pain  Sinus pain happens when your sinuses get swollen or blocked (clogged). Sinuses are spaces behind the bones of your face and forehead. You may feel pain or pressure in your face, forehead, ears, or upper teeth. Sinus pain can be mild or very bad. What are the causes? Sinus pain can result from conditions that affect your sinuses. Common causes include: Colds. Sinus infections. Allergies. What are the signs or symptoms? The main symptom of this condition is pain or pressure in your face, forehead, ears, or upper teeth. People who have sinus pain often have other symptoms, such as: Stuffed up or runny nose. Fever. Not being able to smell. Headache. Weather changes can make your symptoms worse. How is this treated? Treatment for this condition depends on the cause. Sinus pain caused by: A sinus infection may be treated with antibiotic medicine. A stuffy nose may be helped by rinsing out the nose and sinuses with a salt water solution. Allergies may be helped by allergy medicines and nasal sprays. Surgery may be needed in some cases if other treatments do not help. Follow these instructions at home: General instructions If told: Apply a warm, moist washcloth to your face. This can help to lessen pain. Use a nasal salt water wash. Follow the directions on the bottle or box. Hydrate and humidify Drink enough water to keep your pee (urine) pale yellow. Use a humidifier if your home is dry. Breathe in steam for 10-15 minutes, 3-4 times a day or as told by your doctor. You can do this in the bathroom while a hot shower is running. Try not to spend time in cool or dry air. Medicines  Take over-the-counter and prescription medicines only as told by your doctor. If you were prescribed an antibiotic medicine,  take it as told by your doctor. Do not stop taking it even if you start to feel better. Use a nose spray if your nose feels full of mucus (congested). Contact a doctor if: You get more than one headache a week. Light or sound bothers you. You have a fever. You feel sick to your stomach (nauseous) or you vomit. Your headaches do not get better with treatment. Get help right away if: You have trouble seeing. You suddenly have very bad pain in your face or head. You start to have quick, sudden movements or shaking that you cannot control (seizure). You are confused. You have a stiff neck. Summary Sinus pain happens when your sinuses get swollen or blocked (clogged). Sinuses are spaces behind the bones of your face and forehead. You may feel pain or pressure in your face, forehead, ears, or upper teeth. Take over-the-counter and prescription medicines only as told by your doctor. If told, apply a warm, moist washcloth to your face. This can help to lessen pain. This information is not intended to replace advice given to you by your health care provider. Make sure you discuss any questions you have with your health care provider. Document Revised: 10/07/2021 Document Reviewed: 10/07/2021 Elsevier Patient Education  2024 [employer].

## 2023-08-17 NOTE — Assessment & Plan Note (Signed)
-   Not acutely exacerbated. No shortness of breath, wheezing, chest tightness or cough. No recent SABA use. He did not tolerated Symbicort. Continue Albuterol HFA 2 puffs every 4-6 hours as needed.

## 2023-08-17 NOTE — Assessment & Plan Note (Signed)
-   Patient has chronic sinusitis with recurrent exacerbations requiring steroids and antibiotics.  Symptoms significantly improved on to pick sent however he has been experiencing arthralgia which he possible attributes to medication.  Only way to tell if medication is causing joint pain is to temporarily come off biologic.  Continue Claritin, pseudoephedrine, Flonase/Astelin and sinus resident daily.  He was given prescription for doxycycline today due to acute sinusitis symptoms.  Holding off on prednisone at this time.  We will follow-up in 3 months.

## 2023-08-18 ENCOUNTER — Other Ambulatory Visit: Payer: Self-pay

## 2023-08-18 ENCOUNTER — Other Ambulatory Visit (HOSPITAL_COMMUNITY): Payer: Self-pay

## 2023-08-18 ENCOUNTER — Ambulatory Visit (INDEPENDENT_AMBULATORY_CARE_PROVIDER_SITE_OTHER): Payer: 59

## 2023-08-18 ENCOUNTER — Ambulatory Visit (INDEPENDENT_AMBULATORY_CARE_PROVIDER_SITE_OTHER): Payer: 59 | Admitting: Podiatry

## 2023-08-18 DIAGNOSIS — M722 Plantar fascial fibromatosis: Secondary | ICD-10-CM | POA: Diagnosis not present

## 2023-08-18 DIAGNOSIS — M79672 Pain in left foot: Secondary | ICD-10-CM

## 2023-08-18 MED ORDER — BETAMETHASONE SOD PHOS & ACET 6 (3-3) MG/ML IJ SUSP
3.0000 mg | Freq: Once | INTRAMUSCULAR | Status: AC
Start: 2023-08-18 — End: ?

## 2023-08-18 MED ORDER — METHYLPREDNISOLONE 4 MG PO TBPK
ORAL_TABLET | ORAL | 0 refills | Status: AC
  Filled 2023-08-18: qty 21, 6d supply, fill #0

## 2023-08-18 MED ORDER — CEFDINIR 300 MG PO CAPS
300.0000 mg | ORAL_CAPSULE | Freq: Two times a day (BID) | ORAL | 0 refills | Status: AC
  Filled 2023-08-18 – 2023-08-19 (×2): qty 14, 7d supply, fill #0

## 2023-08-18 NOTE — Addendum Note (Signed)
Addended by: Glenford Bayley on: 08/18/2023 03:14 PM   Modules accepted: Orders

## 2023-08-18 NOTE — Progress Notes (Signed)
   Chief Complaint  Patient presents with   Foot Pain    LEFT HEEL PAIN STARTED 04/2022, PAIN STARTED MID FOOT BUT NOW PAIN IS IN THE HEEL, PAIN LEVEL 4-5/10, WHEN WALKING BAREFOOTED IT IS WORSE    Subjective: 62 y.o. male presenting today as a new patient for evaluation of left heel pain ongoing for almost 1.5 years now.  Onset when he was shoveling and doing yard work around the house.  He has tried conservative treatment including ice with stretching exercises.  He continues to have pain on a daily basis.  He has tried different shoes as well.   Past Medical History:  Diagnosis Date   GERD (gastroesophageal reflux disease)    High blood pressure    Lumbar herniated disc    Palpitations    Past Surgical History:  Procedure Laterality Date   NASAL SEPTUM SURGERY     No Known Allergies   Objective: Physical Exam General: The patient is alert and oriented x3 in no acute distress.  Dermatology: Skin is warm, dry and supple bilateral lower extremities. Negative for open lesions or macerations bilateral.   Vascular: Dorsalis Pedis and Posterior Tibial pulses palpable bilateral.  Capillary fill time is immediate to all digits.  Neurological: Grossly intact via light touch  Musculoskeletal: Tenderness to palpation to the plantar aspect of the left heel along the plantar fascia. All other joints range of motion within normal limits bilateral. Strength 5/5 in all groups bilateral.   Radiographic exam LT foot 08/18/2023: Normal osseous mineralization. Joint spaces preserved. No fracture/dislocation/boney destruction. No other soft tissue abnormalities or radiopaque foreign bodies.  Plantar heel spur noted  Assessment: 1. Plantar fasciitis left foot 2.  Plantar heel spur left  Plan of Care:  -Patient evaluated. Xrays reviewed.   -Injection of 0.5cc Celestone soluspan injected into the left plantar fascia.  -Rx for Medrol Dose Pak placed -Patient currently taking Etorolac for back  pain.  No NSAIDs prescribed -Recommend supportive shoes from [employer] running store.  Also recommend OOFOS house slides -Continue conservative modalities including ice and stretching exercises -Return to clinic 4 weeks   Felecia Shelling, DPM [facility]  Dr. Felecia Shelling, DPM    [address] [place], [place] [address]                Office [phone]  Fax [phone]

## 2023-08-18 NOTE — Patient Instructions (Signed)
[employer] off of [address] here in [employer] for around the house

## 2023-08-19 ENCOUNTER — Other Ambulatory Visit (HOSPITAL_COMMUNITY): Payer: Self-pay

## 2023-08-19 ENCOUNTER — Other Ambulatory Visit: Payer: Self-pay

## 2023-08-19 NOTE — Telephone Encounter (Signed)
Called patient.  Patient had an office visit with Ames Dura, NP on 08/15/2023.  This encounter was resolved.  Patient states nothing further needed at this time.  Will close this encounter.

## 2023-09-09 ENCOUNTER — Other Ambulatory Visit (HOSPITAL_COMMUNITY): Payer: Self-pay

## 2023-09-11 ENCOUNTER — Other Ambulatory Visit: Payer: Self-pay

## 2023-09-11 NOTE — Progress Notes (Signed)
Patient's Dupixent currently being held. Per [facility], patient has been paused. She will advise if he restarts treatment after future follow up.

## 2023-09-15 ENCOUNTER — Ambulatory Visit (INDEPENDENT_AMBULATORY_CARE_PROVIDER_SITE_OTHER): Payer: 59 | Admitting: Podiatry

## 2023-09-15 ENCOUNTER — Other Ambulatory Visit: Payer: Self-pay

## 2023-09-15 ENCOUNTER — Encounter: Payer: Self-pay | Admitting: Podiatry

## 2023-09-15 ENCOUNTER — Other Ambulatory Visit (HOSPITAL_COMMUNITY): Payer: Self-pay

## 2023-09-15 VITALS — Ht 70.0 in | Wt 204.0 lb

## 2023-09-15 DIAGNOSIS — M722 Plantar fascial fibromatosis: Secondary | ICD-10-CM

## 2023-09-15 MED ORDER — ETODOLAC 400 MG PO TABS
400.0000 mg | ORAL_TABLET | Freq: Two times a day (BID) | ORAL | 0 refills | Status: DC
  Filled 2023-09-15: qty 180, 90d supply, fill #0

## 2023-09-15 MED ORDER — BETAMETHASONE SOD PHOS & ACET 6 (3-3) MG/ML IJ SUSP
3.0000 mg | Freq: Once | INTRAMUSCULAR | Status: AC
Start: 2023-09-15 — End: 2023-09-15
  Administered 2023-09-15: 3 mg via INTRA_ARTICULAR

## 2023-09-15 NOTE — Patient Instructions (Signed)
Endoscopic Plantar Fasciotomy

## 2023-09-15 NOTE — Progress Notes (Signed)
   No chief complaint on file.   Subjective: 62 y.o. male presenting today for follow-up evaluation of left heel pain ongoing for almost 1.5 years now.  Onset when he was shoveling and doing yard work around the house.   Overall the patient states there is some modest improvement.  Approximately 30% better.  He continues to have pain and tenderness however.   Past Medical History:  Diagnosis Date   GERD (gastroesophageal reflux disease)    High blood pressure    Lumbar herniated disc    Palpitations    Past Surgical History:  Procedure Laterality Date   NASAL SEPTUM SURGERY     No Known Allergies   Objective: Physical Exam General: The patient is alert and oriented x3 in no acute distress.  Dermatology: Skin is warm, dry and supple bilateral lower extremities. Negative for open lesions or macerations bilateral.   Vascular: Dorsalis Pedis and Posterior Tibial pulses palpable bilateral.  Capillary fill time is immediate to all digits.  Neurological: Grossly intact via light touch  Musculoskeletal: There continues to be some tenderness to palpation to the plantar aspect of the left heel along the plantar fascia. All other joints range of motion within normal limits bilateral. Strength 5/5 in all groups bilateral.   Radiographic exam LT foot 08/18/2023: Normal osseous mineralization. Joint spaces preserved. No fracture/dislocation/boney destruction. No other soft tissue abnormalities or radiopaque foreign bodies.  Plantar heel spur noted  Assessment: 1. Plantar fasciitis left foot 2.  Plantar heel spur left  Plan of Care:  -Patient evaluated. Xrays reviewed.   -Injection of 0.5cc Celestone soluspan injected into the left plantar fascia.  -Patient currently taking Etorolac for back pain.  No NSAIDs prescribed - Appointment with orthotics department for custom molded orthotics to support the medial longitudinal arch of the foot and alleviate pressure from the heel -The patient  has now had the pain associated to the left heel for about 1.5 years now despite conservative treatment and management.  We did discuss the possibility of endoscopic plantar fasciotomy if the patient fails to improve over the following few months -Return to clinic 4 weeks  Felecia Shelling, DPM [facility]  Dr. Felecia Shelling, DPM    [address] [place], [place] [address]                Office [phone]  Fax [phone]

## 2023-09-16 ENCOUNTER — Other Ambulatory Visit (HOSPITAL_COMMUNITY): Payer: Self-pay

## 2023-09-17 ENCOUNTER — Other Ambulatory Visit (HOSPITAL_COMMUNITY): Payer: Self-pay

## 2023-09-18 ENCOUNTER — Other Ambulatory Visit (HOSPITAL_COMMUNITY): Payer: Self-pay

## 2023-09-19 ENCOUNTER — Other Ambulatory Visit (HOSPITAL_COMMUNITY): Payer: Self-pay

## 2023-09-22 ENCOUNTER — Other Ambulatory Visit (HOSPITAL_COMMUNITY): Payer: Self-pay

## 2023-09-22 MED ORDER — ALBUTEROL SULFATE HFA 108 (90 BASE) MCG/ACT IN AERS
2.0000 | INHALATION_SPRAY | Freq: Four times a day (QID) | RESPIRATORY_TRACT | 3 refills | Status: DC
  Filled 2023-09-22: qty 6.7, 30d supply, fill #0
  Filled 2023-12-15: qty 6.7, 30d supply, fill #1
  Filled 2024-02-03: qty 6.7, 30d supply, fill #2
  Filled 2024-04-11: qty 6.7, 30d supply, fill #3

## 2023-10-03 DIAGNOSIS — M542 Cervicalgia: Secondary | ICD-10-CM | POA: Diagnosis not present

## 2023-10-03 DIAGNOSIS — M25511 Pain in right shoulder: Secondary | ICD-10-CM | POA: Diagnosis not present

## 2023-10-21 ENCOUNTER — Telehealth: Payer: Self-pay | Admitting: Primary Care

## 2023-10-21 DIAGNOSIS — J320 Chronic maxillary sinusitis: Secondary | ICD-10-CM

## 2023-10-21 NOTE — Telephone Encounter (Signed)
Patient states needs referral to ENT. Patient phone number is [phone].

## 2023-10-22 ENCOUNTER — Other Ambulatory Visit (HOSPITAL_COMMUNITY): Payer: Self-pay

## 2023-10-23 ENCOUNTER — Other Ambulatory Visit: Payer: Self-pay

## 2023-10-23 ENCOUNTER — Other Ambulatory Visit (HOSPITAL_COMMUNITY): Payer: Self-pay

## 2023-10-23 MED ORDER — CYCLOBENZAPRINE HCL 10 MG PO TABS
10.0000 mg | ORAL_TABLET | Freq: Every evening | ORAL | 1 refills | Status: DC | PRN
  Filled 2023-10-23: qty 30, 30d supply, fill #0
  Filled 2024-03-15: qty 30, 30d supply, fill #1

## 2023-10-23 MED ORDER — METOPROLOL TARTRATE 50 MG PO TABS
25.0000 mg | ORAL_TABLET | Freq: Two times a day (BID) | ORAL | 1 refills | Status: DC
  Filled 2023-10-23: qty 90, 90d supply, fill #0
  Filled 2024-03-15: qty 90, 90d supply, fill #1

## 2023-10-27 ENCOUNTER — Ambulatory Visit: Payer: 59 | Admitting: Podiatry

## 2023-11-01 NOTE — Telephone Encounter (Signed)
Dr. Isaiah Serge, are you okay with the referral being placed?

## 2023-11-03 NOTE — Telephone Encounter (Signed)
Ok with the referral for ENT for chronic sinusitis

## 2023-11-04 NOTE — Telephone Encounter (Signed)
 ENT referral placed  Patient notified

## 2023-11-06 ENCOUNTER — Other Ambulatory Visit (HOSPITAL_COMMUNITY): Payer: Self-pay

## 2023-11-07 ENCOUNTER — Other Ambulatory Visit: Payer: Self-pay

## 2023-11-08 ENCOUNTER — Other Ambulatory Visit (HOSPITAL_COMMUNITY): Payer: Self-pay

## 2023-11-08 MED ORDER — PSEUDOEPHEDRINE HCL 30 MG PO TABS
30.0000 mg | ORAL_TABLET | Freq: Four times a day (QID) | ORAL | 1 refills | Status: DC | PRN
  Filled 2023-11-08: qty 100, 25d supply, fill #0
  Filled 2024-02-03: qty 100, 25d supply, fill #1

## 2023-11-10 ENCOUNTER — Other Ambulatory Visit: Payer: Self-pay

## 2023-11-14 ENCOUNTER — Ambulatory Visit: Payer: 59 | Admitting: Primary Care

## 2023-11-17 ENCOUNTER — Encounter (INDEPENDENT_AMBULATORY_CARE_PROVIDER_SITE_OTHER): Payer: Self-pay

## 2023-11-24 ENCOUNTER — Encounter (INDEPENDENT_AMBULATORY_CARE_PROVIDER_SITE_OTHER): Payer: Self-pay | Admitting: Otolaryngology

## 2023-12-15 ENCOUNTER — Other Ambulatory Visit: Payer: Self-pay | Admitting: Primary Care

## 2023-12-15 ENCOUNTER — Other Ambulatory Visit: Payer: Self-pay

## 2023-12-16 ENCOUNTER — Encounter: Payer: Self-pay | Admitting: Primary Care

## 2023-12-16 ENCOUNTER — Other Ambulatory Visit: Payer: Self-pay

## 2023-12-17 ENCOUNTER — Other Ambulatory Visit (HOSPITAL_COMMUNITY): Payer: Self-pay

## 2023-12-17 DIAGNOSIS — H6123 Impacted cerumen, bilateral: Secondary | ICD-10-CM | POA: Diagnosis not present

## 2023-12-17 DIAGNOSIS — H903 Sensorineural hearing loss, bilateral: Secondary | ICD-10-CM | POA: Diagnosis not present

## 2023-12-17 DIAGNOSIS — J343 Hypertrophy of nasal turbinates: Secondary | ICD-10-CM | POA: Diagnosis not present

## 2023-12-17 DIAGNOSIS — J3489 Other specified disorders of nose and nasal sinuses: Secondary | ICD-10-CM | POA: Diagnosis not present

## 2023-12-17 DIAGNOSIS — J310 Chronic rhinitis: Secondary | ICD-10-CM | POA: Diagnosis not present

## 2023-12-17 DIAGNOSIS — H9312 Tinnitus, left ear: Secondary | ICD-10-CM | POA: Diagnosis not present

## 2023-12-17 DIAGNOSIS — J324 Chronic pansinusitis: Secondary | ICD-10-CM | POA: Diagnosis not present

## 2023-12-17 DIAGNOSIS — H900 Conductive hearing loss, bilateral: Secondary | ICD-10-CM | POA: Diagnosis not present

## 2023-12-17 MED ORDER — CEFDINIR 300 MG PO CAPS
300.0000 mg | ORAL_CAPSULE | Freq: Two times a day (BID) | ORAL | 0 refills | Status: AC
  Filled 2023-12-17: qty 28, 14d supply, fill #0

## 2023-12-19 ENCOUNTER — Ambulatory Visit: Payer: 59 | Admitting: Primary Care

## 2023-12-22 ENCOUNTER — Other Ambulatory Visit (HOSPITAL_COMMUNITY): Payer: Self-pay

## 2023-12-22 MED ORDER — LEVOFLOXACIN 500 MG PO TABS
500.0000 mg | ORAL_TABLET | Freq: Every day | ORAL | 0 refills | Status: DC
  Filled 2023-12-22: qty 14, 14d supply, fill #0

## 2023-12-23 ENCOUNTER — Other Ambulatory Visit (HOSPITAL_COMMUNITY): Payer: Self-pay

## 2023-12-24 ENCOUNTER — Other Ambulatory Visit (HOSPITAL_COMMUNITY): Payer: Self-pay

## 2023-12-24 ENCOUNTER — Other Ambulatory Visit: Payer: Self-pay

## 2023-12-24 MED ORDER — ETODOLAC 400 MG PO TABS
400.0000 mg | ORAL_TABLET | Freq: Two times a day (BID) | ORAL | 0 refills | Status: DC
  Filled 2023-12-24: qty 180, 90d supply, fill #0

## 2024-01-14 ENCOUNTER — Other Ambulatory Visit: Payer: Self-pay | Admitting: Otolaryngology

## 2024-01-14 DIAGNOSIS — J324 Chronic pansinusitis: Secondary | ICD-10-CM

## 2024-01-26 ENCOUNTER — Ambulatory Visit
Admission: RE | Admit: 2024-01-26 | Discharge: 2024-01-26 | Disposition: A | Discharge status: Home / Self Care | Payer: 59 | Source: Ambulatory Visit | Attending: Otolaryngology | Admitting: Otolaryngology

## 2024-01-26 DIAGNOSIS — J324 Chronic pansinusitis: Secondary | ICD-10-CM | POA: Insufficient documentation

## 2024-02-04 ENCOUNTER — Other Ambulatory Visit (HOSPITAL_COMMUNITY): Payer: Self-pay

## 2024-02-04 ENCOUNTER — Other Ambulatory Visit: Payer: Self-pay

## 2024-02-04 DIAGNOSIS — J309 Allergic rhinitis, unspecified: Secondary | ICD-10-CM | POA: Diagnosis not present

## 2024-02-04 DIAGNOSIS — J324 Chronic pansinusitis: Secondary | ICD-10-CM | POA: Diagnosis not present

## 2024-02-04 MED ORDER — LEVOFLOXACIN 500 MG PO TABS
500.0000 mg | ORAL_TABLET | Freq: Every day | ORAL | 0 refills | Status: AC
  Filled 2024-02-04: qty 14, 14d supply, fill #0

## 2024-02-05 ENCOUNTER — Other Ambulatory Visit (HOSPITAL_COMMUNITY): Payer: Self-pay

## 2024-02-23 DIAGNOSIS — E291 Testicular hypofunction: Secondary | ICD-10-CM | POA: Diagnosis not present

## 2024-02-23 DIAGNOSIS — R7309 Other abnormal glucose: Secondary | ICD-10-CM | POA: Diagnosis not present

## 2024-02-23 DIAGNOSIS — E559 Vitamin D deficiency, unspecified: Secondary | ICD-10-CM | POA: Diagnosis not present

## 2024-02-23 DIAGNOSIS — M545 Low back pain, unspecified: Secondary | ICD-10-CM | POA: Diagnosis not present

## 2024-02-23 DIAGNOSIS — Z1321 Encounter for screening for nutritional disorder: Secondary | ICD-10-CM | POA: Diagnosis not present

## 2024-02-23 DIAGNOSIS — K219 Gastro-esophageal reflux disease without esophagitis: Secondary | ICD-10-CM | POA: Diagnosis not present

## 2024-02-23 DIAGNOSIS — Z13220 Encounter for screening for lipoid disorders: Secondary | ICD-10-CM | POA: Diagnosis not present

## 2024-02-23 DIAGNOSIS — Z1329 Encounter for screening for other suspected endocrine disorder: Secondary | ICD-10-CM | POA: Diagnosis not present

## 2024-02-23 DIAGNOSIS — R5383 Other fatigue: Secondary | ICD-10-CM | POA: Diagnosis not present

## 2024-02-23 DIAGNOSIS — B379 Candidiasis, unspecified: Secondary | ICD-10-CM | POA: Diagnosis not present

## 2024-03-15 ENCOUNTER — Other Ambulatory Visit (HOSPITAL_COMMUNITY): Payer: Self-pay

## 2024-03-16 ENCOUNTER — Other Ambulatory Visit (HOSPITAL_COMMUNITY): Payer: Self-pay

## 2024-03-16 ENCOUNTER — Other Ambulatory Visit: Payer: Self-pay

## 2024-03-17 ENCOUNTER — Other Ambulatory Visit (HOSPITAL_COMMUNITY): Payer: Self-pay

## 2024-03-17 ENCOUNTER — Other Ambulatory Visit: Payer: Self-pay

## 2024-03-17 MED ORDER — ETODOLAC 400 MG PO TABS
400.0000 mg | ORAL_TABLET | Freq: Two times a day (BID) | ORAL | 0 refills | Status: DC
  Filled 2024-03-17: qty 180, 90d supply, fill #0

## 2024-04-07 ENCOUNTER — Other Ambulatory Visit (HOSPITAL_COMMUNITY): Payer: Self-pay

## 2024-04-11 ENCOUNTER — Other Ambulatory Visit (HOSPITAL_COMMUNITY): Payer: Self-pay

## 2024-04-12 ENCOUNTER — Other Ambulatory Visit (HOSPITAL_COMMUNITY): Payer: Self-pay

## 2024-04-13 ENCOUNTER — Other Ambulatory Visit: Payer: Self-pay

## 2024-04-14 ENCOUNTER — Other Ambulatory Visit (HOSPITAL_COMMUNITY): Payer: Self-pay

## 2024-04-14 ENCOUNTER — Other Ambulatory Visit: Payer: Self-pay

## 2024-04-14 MED ORDER — PSEUDOEPHEDRINE HCL 30 MG PO TABS
30.0000 mg | ORAL_TABLET | Freq: Four times a day (QID) | ORAL | 0 refills | Status: DC | PRN
  Filled 2024-04-14: qty 100, 25d supply, fill #0

## 2024-04-15 ENCOUNTER — Other Ambulatory Visit: Payer: Self-pay

## 2024-06-04 ENCOUNTER — Other Ambulatory Visit (HOSPITAL_COMMUNITY): Payer: Self-pay

## 2024-06-04 ENCOUNTER — Other Ambulatory Visit: Payer: Self-pay

## 2024-06-04 MED ORDER — PSEUDOEPHEDRINE HCL 30 MG PO TABS
30.0000 mg | ORAL_TABLET | Freq: Four times a day (QID) | ORAL | 0 refills | Status: DC | PRN
  Filled 2024-06-04: qty 100, 25d supply, fill #0

## 2024-06-14 ENCOUNTER — Other Ambulatory Visit (HOSPITAL_COMMUNITY): Payer: Self-pay

## 2024-06-16 ENCOUNTER — Other Ambulatory Visit (HOSPITAL_COMMUNITY): Payer: Self-pay

## 2024-06-16 ENCOUNTER — Other Ambulatory Visit: Payer: Self-pay

## 2024-06-16 MED ORDER — METOPROLOL TARTRATE 50 MG PO TABS
25.0000 mg | ORAL_TABLET | Freq: Two times a day (BID) | ORAL | 0 refills | Status: DC
  Filled 2024-06-16: qty 90, 90d supply, fill #0

## 2024-07-07 ENCOUNTER — Other Ambulatory Visit (HOSPITAL_COMMUNITY): Payer: Self-pay

## 2024-07-08 ENCOUNTER — Other Ambulatory Visit (HOSPITAL_COMMUNITY): Payer: Self-pay

## 2024-07-08 ENCOUNTER — Other Ambulatory Visit: Payer: Self-pay

## 2024-07-08 MED ORDER — CYCLOBENZAPRINE HCL 10 MG PO TABS
10.0000 mg | ORAL_TABLET | Freq: Every evening | ORAL | 1 refills | Status: AC | PRN
  Filled 2024-07-08: qty 30, 30d supply, fill #0

## 2024-07-08 MED ORDER — AZELASTINE HCL 137 MCG/SPRAY NA SOLN
1.0000 | Freq: Two times a day (BID) | NASAL | 1 refills | Status: AC
  Filled 2024-07-08: qty 90, 90d supply, fill #0

## 2024-07-08 MED ORDER — ESOMEPRAZOLE MAGNESIUM 40 MG PO CPDR
40.0000 mg | DELAYED_RELEASE_CAPSULE | Freq: Every day | ORAL | 4 refills | Status: AC | PRN
  Filled 2024-07-08: qty 30, 30d supply, fill #0

## 2024-07-08 MED ORDER — ETODOLAC 400 MG PO TABS
400.0000 mg | ORAL_TABLET | Freq: Two times a day (BID) | ORAL | 0 refills | Status: AC
  Filled 2024-07-08: qty 180, 90d supply, fill #0

## 2024-07-08 MED ORDER — ALBUTEROL SULFATE HFA 108 (90 BASE) MCG/ACT IN AERS
2.0000 | INHALATION_SPRAY | Freq: Four times a day (QID) | RESPIRATORY_TRACT | 3 refills | Status: AC | PRN
  Filled 2024-07-08: qty 6.7, 25d supply, fill #0

## 2024-07-29 ENCOUNTER — Other Ambulatory Visit: Payer: Self-pay

## 2024-07-29 ENCOUNTER — Other Ambulatory Visit (HOSPITAL_COMMUNITY): Payer: Self-pay

## 2024-07-29 MED ORDER — VALACYCLOVIR HCL 1 G PO TABS
1000.0000 mg | ORAL_TABLET | Freq: Two times a day (BID) | ORAL | 1 refills | Status: AC
  Filled 2024-07-29 – 2024-08-02 (×2): qty 60, 30d supply, fill #0
  Filled 2024-08-29: qty 60, 30d supply, fill #1

## 2024-08-01 ENCOUNTER — Other Ambulatory Visit (HOSPITAL_COMMUNITY): Payer: Self-pay

## 2024-08-02 ENCOUNTER — Other Ambulatory Visit (HOSPITAL_COMMUNITY): Payer: Self-pay

## 2024-08-03 ENCOUNTER — Other Ambulatory Visit (HOSPITAL_COMMUNITY): Payer: Self-pay

## 2024-08-03 ENCOUNTER — Other Ambulatory Visit: Payer: Self-pay

## 2024-08-03 MED ORDER — PSEUDOEPHEDRINE HCL 30 MG PO TABS
30.0000 mg | ORAL_TABLET | Freq: Four times a day (QID) | ORAL | 0 refills | Status: DC | PRN
  Filled 2024-08-03: qty 100, 25d supply, fill #0

## 2024-08-04 ENCOUNTER — Other Ambulatory Visit: Payer: Self-pay

## 2024-08-29 ENCOUNTER — Other Ambulatory Visit (HOSPITAL_COMMUNITY): Payer: Self-pay

## 2024-08-30 ENCOUNTER — Other Ambulatory Visit: Payer: Self-pay

## 2024-09-01 ENCOUNTER — Other Ambulatory Visit (HOSPITAL_COMMUNITY): Payer: Self-pay

## 2024-09-01 ENCOUNTER — Other Ambulatory Visit: Payer: Self-pay

## 2024-09-01 MED ORDER — PSEUDOEPHEDRINE HCL 30 MG PO TABS
ORAL_TABLET | ORAL | 0 refills | Status: AC
  Filled 2024-09-01: qty 100, 25d supply, fill #0

## 2024-09-22 ENCOUNTER — Other Ambulatory Visit (HOSPITAL_COMMUNITY): Payer: Self-pay

## 2024-09-24 ENCOUNTER — Other Ambulatory Visit (HOSPITAL_COMMUNITY): Payer: Self-pay

## 2024-09-24 ENCOUNTER — Other Ambulatory Visit: Payer: Self-pay

## 2024-09-24 MED ORDER — METOPROLOL TARTRATE 50 MG PO TABS
25.0000 mg | ORAL_TABLET | Freq: Two times a day (BID) | ORAL | 3 refills | Status: AC
  Filled 2024-09-24: qty 90, 90d supply, fill #0

## 2024-10-04 ENCOUNTER — Other Ambulatory Visit (HOSPITAL_COMMUNITY): Payer: Self-pay

## 2024-10-04 MED ORDER — AMOXICILLIN-POT CLAVULANATE 875-125 MG PO TABS
1.0000 | ORAL_TABLET | Freq: Two times a day (BID) | ORAL | 1 refills | Status: AC
  Filled 2024-10-04 – 2024-10-05 (×2): qty 28, 14d supply, fill #0

## 2024-10-05 ENCOUNTER — Other Ambulatory Visit (HOSPITAL_COMMUNITY): Payer: Self-pay

## 2024-10-05 ENCOUNTER — Other Ambulatory Visit: Payer: Self-pay

## 2024-10-11 ENCOUNTER — Other Ambulatory Visit (HOSPITAL_COMMUNITY): Payer: Self-pay

## 2024-10-15 ENCOUNTER — Other Ambulatory Visit (HOSPITAL_COMMUNITY): Payer: Self-pay

## 2024-10-18 ENCOUNTER — Other Ambulatory Visit (HOSPITAL_COMMUNITY): Payer: Self-pay

## 2024-10-18 ENCOUNTER — Other Ambulatory Visit: Payer: Self-pay

## 2024-10-18 MED ORDER — ETODOLAC 400 MG PO TABS
400.0000 mg | ORAL_TABLET | Freq: Two times a day (BID) | ORAL | 0 refills | Status: AC
  Filled 2024-10-18: qty 180, 90d supply, fill #0

## 2024-10-26 ENCOUNTER — Other Ambulatory Visit (HOSPITAL_COMMUNITY): Payer: Self-pay

## 2024-10-27 ENCOUNTER — Other Ambulatory Visit (HOSPITAL_COMMUNITY): Payer: Self-pay

## 2024-10-28 ENCOUNTER — Other Ambulatory Visit: Payer: Self-pay

## 2024-10-28 ENCOUNTER — Other Ambulatory Visit (HOSPITAL_COMMUNITY): Payer: Self-pay

## 2024-10-28 MED ORDER — PSEUDOEPHEDRINE HCL 30 MG PO TABS
30.0000 mg | ORAL_TABLET | Freq: Four times a day (QID) | ORAL | 1 refills | Status: AC | PRN
  Filled 2024-10-28: qty 100, 25d supply, fill #0
  Filled 2024-11-29: qty 100, 25d supply, fill #1

## 2024-10-29 ENCOUNTER — Other Ambulatory Visit: Payer: Self-pay

## 2024-11-01 ENCOUNTER — Other Ambulatory Visit (HOSPITAL_COMMUNITY): Payer: Self-pay

## 2024-11-01 MED ORDER — METHYLPREDNISOLONE 4 MG PO TBPK
ORAL_TABLET | ORAL | 0 refills | Status: AC
  Filled 2024-11-01: qty 21, 6d supply, fill #0

## 2024-11-01 MED ORDER — CEFDINIR 300 MG PO CAPS
300.0000 mg | ORAL_CAPSULE | Freq: Two times a day (BID) | ORAL | 0 refills | Status: AC
  Filled 2024-11-01: qty 28, 14d supply, fill #0

## 2024-11-04 ENCOUNTER — Other Ambulatory Visit (HOSPITAL_COMMUNITY): Payer: Self-pay

## 2024-11-29 ENCOUNTER — Other Ambulatory Visit: Payer: Self-pay

## 2024-12-13 ENCOUNTER — Other Ambulatory Visit: Payer: Self-pay
